# Patient Record
Sex: Male | Born: 1935
Health system: Southern US, Community
[De-identification: ages and names within clinical notes are randomized; demographics above are authoritative.]

---

## 2004-05-26 ENCOUNTER — Other Ambulatory Visit: Payer: Self-pay

## 2005-04-12 ENCOUNTER — Other Ambulatory Visit: Payer: Self-pay

## 2005-04-12 ENCOUNTER — Emergency Department: Payer: Self-pay | Admitting: Emergency Medicine

## 2005-04-13 ENCOUNTER — Other Ambulatory Visit: Payer: Self-pay

## 2005-04-13 ENCOUNTER — Observation Stay: Payer: Self-pay | Admitting: Infectious Diseases

## 2007-03-29 ENCOUNTER — Emergency Department: Payer: Self-pay | Admitting: Emergency Medicine

## 2011-10-10 ENCOUNTER — Observation Stay: Payer: Self-pay | Admitting: Internal Medicine

## 2011-10-10 DIAGNOSIS — I4891 Unspecified atrial fibrillation: Secondary | ICD-10-CM

## 2011-10-10 DIAGNOSIS — I379 Nonrheumatic pulmonary valve disorder, unspecified: Secondary | ICD-10-CM

## 2011-10-10 DIAGNOSIS — R748 Abnormal levels of other serum enzymes: Secondary | ICD-10-CM

## 2011-10-10 LAB — CK TOTAL AND CKMB (NOT AT ARMC)
CK, Total: 315 U/L — ABNORMAL HIGH (ref 35–232)
CK, Total: 233 U/L — ABNORMAL HIGH (ref 35–232)
CK-MB: 4.5 ng/mL — ABNORMAL HIGH (ref 0.5–3.6)
CK-MB: 3.5 ng/mL (ref 0.5–3.6)

## 2011-10-10 LAB — CBC
HGB: 15.6 g/dL (ref 13.0–18.0)
MCH: 31.6 pg (ref 26.0–34.0)
MCV: 95 fL (ref 80–100)
Platelet: 120 10*3/uL — ABNORMAL LOW (ref 150–440)
RBC: 4.95 10*6/uL (ref 4.40–5.90)
WBC: 7.2 10*3/uL (ref 3.8–10.6)

## 2011-10-10 LAB — URINALYSIS, COMPLETE
Glucose,UR: 50 mg/dL (ref 0–75)
Ketone: NEGATIVE
Nitrite: NEGATIVE
Protein: NEGATIVE
Specific Gravity: 1.001 (ref 1.003–1.030)
Squamous Epithelial: <1
WBC UR: 1 /HPF (ref 0–5)

## 2011-10-10 LAB — COMPREHENSIVE METABOLIC PANEL
Alkaline Phosphatase: 68 U/L (ref 50–136)
Calcium, Total: 9.4 mg/dL (ref 8.5–10.1)
Chloride: 104 mmol/L (ref 98–107)
Co2: 28 mmol/L (ref 21–32)
Creatinine: 0.99 mg/dL (ref 0.60–1.30)
EGFR (Non-African Amer.): >60
Glucose: 155 mg/dL — ABNORMAL HIGH (ref 65–99)
SGOT(AST): 30 U/L (ref 15–37)
SGPT (ALT): 18 U/L

## 2011-10-10 LAB — TROPONIN I
Troponin-I: 0.08 ng/mL — ABNORMAL HIGH
Troponin-I: 0.08 ng/mL — ABNORMAL HIGH

## 2011-10-10 LAB — APTT: Activated PTT: 33.7 secs (ref 23.6–35.9)

## 2011-10-13 ENCOUNTER — Emergency Department: Payer: Self-pay | Admitting: Emergency Medicine

## 2011-10-13 LAB — CBC WITH DIFFERENTIAL/PLATELET
Basophil #: 0 10*3/uL (ref 0.0–0.1)
Basophil %: 0.1 %
Eosinophil %: 0.1 %
HCT: 48.3 % (ref 40.0–52.0)
Lymphocyte #: 0.5 10*3/uL — ABNORMAL LOW (ref 1.0–3.6)
Lymphocyte %: 4.5 %
MCV: 95 fL (ref 80–100)
Monocyte %: 2.2 %
Neutrophil #: 10.6 10*3/uL — ABNORMAL HIGH (ref 1.4–6.5)
Platelet: 118 10*3/uL — ABNORMAL LOW (ref 150–440)
RBC: 5.11 10*6/uL (ref 4.40–5.90)
WBC: 11.4 10*3/uL — ABNORMAL HIGH (ref 3.8–10.6)

## 2011-10-13 LAB — PROTIME-INR
INR: 1
Prothrombin Time: 13.5 secs (ref 11.5–14.7)

## 2011-10-13 LAB — URINALYSIS, COMPLETE
Glucose,UR: >=500 mg/dL (ref 0–75)
Granular Cast: 1
Ketone: NEGATIVE
Leukocyte Esterase: NEGATIVE
Nitrite: NEGATIVE
Ph: 6 (ref 4.5–8.0)
Protein: 100
Specific Gravity: 1.006 (ref 1.003–1.030)
Squamous Epithelial: <1
WBC UR: <1 /HPF (ref 0–5)

## 2011-10-13 LAB — DRUG SCREEN, URINE
Amphetamines, Ur Screen: NEGATIVE (ref ?–1000)
Benzodiazepine, Ur Scrn: NEGATIVE (ref ?–200)
Cannabinoid 50 Ng, Ur ~~LOC~~: NEGATIVE (ref ?–50)
Cocaine Metabolite,Ur ~~LOC~~: NEGATIVE (ref ?–300)
MDMA (Ecstasy)Ur Screen: NEGATIVE (ref ?–500)

## 2011-10-13 LAB — COMPREHENSIVE METABOLIC PANEL
Albumin: 4.1 g/dL (ref 3.4–5.0)
Anion Gap: 12 (ref 7–16)
Calcium, Total: 9.3 mg/dL (ref 8.5–10.1)
Chloride: 96 mmol/L — ABNORMAL LOW (ref 98–107)
Co2: 31 mmol/L (ref 21–32)
EGFR (African American): >60
EGFR (Non-African Amer.): >60
Glucose: 207 mg/dL — ABNORMAL HIGH (ref 65–99)
Osmolality: 285 (ref 275–301)
Potassium: 3.8 mmol/L (ref 3.5–5.1)
SGOT(AST): 38 U/L — ABNORMAL HIGH (ref 15–37)
Sodium: 139 mmol/L (ref 136–145)

## 2011-10-13 LAB — CK TOTAL AND CKMB (NOT AT ARMC)
CK, Total: 517 U/L — ABNORMAL HIGH (ref 35–232)
CK-MB: 3.7 ng/mL — ABNORMAL HIGH (ref 0.5–3.6)

## 2011-10-13 LAB — ETHANOL: Ethanol: <3 mg/dL

## 2011-10-14 LAB — URINE CULTURE

## 2011-11-19 ENCOUNTER — Ambulatory Visit: Payer: Self-pay | Admitting: Internal Medicine

## 2011-11-21 ENCOUNTER — Inpatient Hospital Stay: Payer: Self-pay | Admitting: Internal Medicine

## 2011-11-21 LAB — CBC
HCT: 42.4 % (ref 40.0–52.0)
HGB: 13.6 g/dL (ref 13.0–18.0)
MCH: 31.2 pg (ref 26.0–34.0)
MCHC: 32 g/dL (ref 32.0–36.0)
RBC: 4.35 10*6/uL — ABNORMAL LOW (ref 4.40–5.90)
RDW: 16 % — ABNORMAL HIGH (ref 11.5–14.5)

## 2011-11-21 LAB — URINALYSIS, COMPLETE
Bacteria: NONE SEEN
Bilirubin,UR: NEGATIVE
Blood: NEGATIVE
Glucose,UR: >=500 mg/dL (ref 0–75)
Leukocyte Esterase: NEGATIVE
Nitrite: NEGATIVE
RBC,UR: 2 /HPF (ref 0–5)
Specific Gravity: 1.018 (ref 1.003–1.030)
Squamous Epithelial: <1
WBC UR: 2 /HPF (ref 0–5)

## 2011-11-21 LAB — COMPREHENSIVE METABOLIC PANEL
Albumin: 2.2 g/dL — ABNORMAL LOW (ref 3.4–5.0)
Alkaline Phosphatase: 129 U/L (ref 50–136)
Bilirubin,Total: 0.5 mg/dL (ref 0.2–1.0)
Calcium, Total: 9.5 mg/dL (ref 8.5–10.1)
Co2: 30 mmol/L (ref 21–32)
Creatinine: 1.66 mg/dL — ABNORMAL HIGH (ref 0.60–1.30)
EGFR (Non-African Amer.): 43 — ABNORMAL LOW
Glucose: 470 mg/dL — ABNORMAL HIGH (ref 65–99)
Osmolality: 356 (ref 275–301)
SGOT(AST): 108 U/L — ABNORMAL HIGH (ref 15–37)
Sodium: 155 mmol/L — ABNORMAL HIGH (ref 136–145)

## 2011-11-21 LAB — TROPONIN I: Troponin-I: 0.16 ng/mL — ABNORMAL HIGH

## 2011-11-21 LAB — PROTIME-INR
INR: 1.2
Prothrombin Time: 15.1 secs — ABNORMAL HIGH (ref 11.5–14.7)

## 2011-11-22 DIAGNOSIS — R748 Abnormal levels of other serum enzymes: Secondary | ICD-10-CM

## 2011-11-22 DIAGNOSIS — R000 Tachycardia, unspecified: Secondary | ICD-10-CM

## 2011-11-22 LAB — CBC WITH DIFFERENTIAL/PLATELET
Basophil #: 0 10*3/uL (ref 0.0–0.1)
HCT: 36.3 % — ABNORMAL LOW (ref 40.0–52.0)
Lymphocyte #: 0.9 10*3/uL — ABNORMAL LOW (ref 1.0–3.6)
MCH: 31.1 pg (ref 26.0–34.0)
MCHC: 32.3 g/dL (ref 32.0–36.0)
MCV: 96 fL (ref 80–100)
Monocyte #: 0.6 10*3/uL (ref 0.0–0.7)
Monocyte %: 2.3 %
Neutrophil #: 23.5 10*3/uL — ABNORMAL HIGH (ref 1.4–6.5)
Neutrophil %: 94.1 %
Platelet: 170 10*3/uL (ref 150–440)
RBC: 3.77 10*6/uL — ABNORMAL LOW (ref 4.40–5.90)
RDW: 15.9 % — ABNORMAL HIGH (ref 11.5–14.5)
WBC: 25 10*3/uL — ABNORMAL HIGH (ref 3.8–10.6)

## 2011-11-22 LAB — BASIC METABOLIC PANEL
Anion Gap: 11 (ref 7–16)
BUN: 85 mg/dL — ABNORMAL HIGH (ref 7–18)
Calcium, Total: 9 mg/dL (ref 8.5–10.1)
Calcium, Total: 9.2 mg/dL (ref 8.5–10.1)
Co2: 29 mmol/L (ref 21–32)
Creatinine: 1.51 mg/dL — ABNORMAL HIGH (ref 0.60–1.30)
EGFR (African American): 58 — ABNORMAL LOW
EGFR (African American): 53 — ABNORMAL LOW
EGFR (Non-African Amer.): 48 — ABNORMAL LOW
EGFR (Non-African Amer.): 44 — ABNORMAL LOW
Glucose: 317 mg/dL — ABNORMAL HIGH (ref 65–99)
Glucose: 260 mg/dL — ABNORMAL HIGH (ref 65–99)
Osmolality: 353 (ref 275–301)
Osmolality: 340 (ref 275–301)
Sodium: 159 mmol/L — ABNORMAL HIGH (ref 136–145)
Sodium: 154 mmol/L — ABNORMAL HIGH (ref 136–145)

## 2011-11-22 LAB — TROPONIN I: Troponin-I: 0.2 ng/mL — ABNORMAL HIGH

## 2011-11-23 LAB — CBC WITH DIFFERENTIAL/PLATELET
Basophil #: 0 10*3/uL (ref 0.0–0.1)
HCT: 35.5 % — ABNORMAL LOW (ref 40.0–52.0)
Lymphocyte %: 3.9 %
MCHC: 31.9 g/dL — ABNORMAL LOW (ref 32.0–36.0)
Monocyte %: 2.8 %
Neutrophil %: 92.6 %
Platelet: 123 10*3/uL — ABNORMAL LOW (ref 150–440)
RDW: 15.9 % — ABNORMAL HIGH (ref 11.5–14.5)
WBC: 14.9 10*3/uL — ABNORMAL HIGH (ref 3.8–10.6)

## 2011-11-23 LAB — COMPREHENSIVE METABOLIC PANEL
Albumin: 1.7 g/dL — ABNORMAL LOW (ref 3.4–5.0)
Anion Gap: 15 (ref 7–16)
BUN: 75 mg/dL — ABNORMAL HIGH (ref 7–18)
Calcium, Total: 8.5 mg/dL (ref 8.5–10.1)
Chloride: 112 mmol/L — ABNORMAL HIGH (ref 98–107)
Co2: 27 mmol/L (ref 21–32)
Creatinine: 1.62 mg/dL — ABNORMAL HIGH (ref 0.60–1.30)
EGFR (African American): 54 — ABNORMAL LOW
EGFR (Non-African Amer.): 44 — ABNORMAL LOW
Glucose: 347 mg/dL — ABNORMAL HIGH (ref 65–99)
Osmolality: 342 (ref 275–301)
Potassium: 3.2 mmol/L — ABNORMAL LOW (ref 3.5–5.1)
SGOT(AST): 37 U/L (ref 15–37)
Total Protein: 6.8 g/dL (ref 6.4–8.2)

## 2011-11-23 LAB — MAGNESIUM: Magnesium: 2.1 mg/dL

## 2011-11-23 LAB — UR PROT ELECTROPHORESIS, URINE RANDOM

## 2011-11-24 LAB — POTASSIUM: Potassium: 3.7 mmol/L (ref 3.5–5.1)

## 2011-11-24 LAB — CBC WITH DIFFERENTIAL/PLATELET
Basophil %: 0 %
Eosinophil #: 0.4 10*3/uL (ref 0.0–0.7)
Eosinophil %: 2.6 %
HGB: 9.9 g/dL — ABNORMAL LOW (ref 13.0–18.0)
Lymphocyte #: 1 10*3/uL (ref 1.0–3.6)
MCHC: 32.8 g/dL (ref 32.0–36.0)
MCV: 95 fL (ref 80–100)
Monocyte #: 0.4 10*3/uL (ref 0.0–0.7)
RDW: 15.3 % — ABNORMAL HIGH (ref 11.5–14.5)
WBC: 14.4 10*3/uL — ABNORMAL HIGH (ref 3.8–10.6)

## 2011-11-24 LAB — BASIC METABOLIC PANEL
Anion Gap: 12 (ref 7–16)
Calcium, Total: 8 mg/dL — ABNORMAL LOW (ref 8.5–10.1)
Chloride: 108 mmol/L — ABNORMAL HIGH (ref 98–107)
Co2: 28 mmol/L (ref 21–32)
Creatinine: 1.36 mg/dL — ABNORMAL HIGH (ref 0.60–1.30)
EGFR (African American): >60
Glucose: 130 mg/dL — ABNORMAL HIGH (ref 65–99)
Potassium: 2.9 mmol/L — ABNORMAL LOW (ref 3.5–5.1)
Sodium: 148 mmol/L — ABNORMAL HIGH (ref 136–145)

## 2011-11-25 LAB — BASIC METABOLIC PANEL
Anion Gap: 11 (ref 7–16)
BUN: 30 mg/dL — ABNORMAL HIGH (ref 7–18)
Calcium, Total: 8.3 mg/dL — ABNORMAL LOW (ref 8.5–10.1)
Co2: 28 mmol/L (ref 21–32)
EGFR (African American): >60
EGFR (Non-African Amer.): >60
Glucose: 194 mg/dL — ABNORMAL HIGH (ref 65–99)
Osmolality: 308 (ref 275–301)
Potassium: 3.6 mmol/L (ref 3.5–5.1)

## 2011-11-26 LAB — BASIC METABOLIC PANEL
BUN: 18 mg/dL (ref 7–18)
Calcium, Total: 8.2 mg/dL — ABNORMAL LOW (ref 8.5–10.1)
Chloride: 111 mmol/L — ABNORMAL HIGH (ref 98–107)
Creatinine: 1.02 mg/dL (ref 0.60–1.30)
EGFR (African American): >60
EGFR (Non-African Amer.): >60
Glucose: 163 mg/dL — ABNORMAL HIGH (ref 65–99)
Sodium: 149 mmol/L — ABNORMAL HIGH (ref 136–145)

## 2011-11-26 LAB — CBC WITH DIFFERENTIAL/PLATELET
Eosinophil %: 3.9 %
HCT: 30.6 % — ABNORMAL LOW (ref 40.0–52.0)
Lymphocyte #: 0.9 10*3/uL — ABNORMAL LOW (ref 1.0–3.6)
Lymphocyte %: 7 %
MCHC: 32.1 g/dL (ref 32.0–36.0)
MCV: 96 fL (ref 80–100)
Monocyte %: 3.7 %
Neutrophil %: 85.4 %
Platelet: 128 10*3/uL — ABNORMAL LOW (ref 150–440)
RBC: 3.2 10*6/uL — ABNORMAL LOW (ref 4.40–5.90)
WBC: 13.6 10*3/uL — ABNORMAL HIGH (ref 3.8–10.6)

## 2011-11-26 LAB — CLOSTRIDIUM DIFFICILE BY PCR

## 2011-11-26 LAB — EXPECTORATED SPUTUM ASSESSMENT W GRAM STAIN, RFLX TO RESP C

## 2011-11-27 LAB — BASIC METABOLIC PANEL
Anion Gap: 10 (ref 7–16)
Calcium, Total: 8.1 mg/dL — ABNORMAL LOW (ref 8.5–10.1)
Co2: 28 mmol/L (ref 21–32)
EGFR (African American): >60
Osmolality: 301 (ref 275–301)

## 2011-11-27 LAB — CULTURE, BLOOD (SINGLE)

## 2011-11-28 LAB — CBC WITH DIFFERENTIAL/PLATELET
Basophil #: 0 10*3/uL (ref 0.0–0.1)
Eosinophil %: 2.1 %
HCT: 32.8 % — ABNORMAL LOW (ref 40.0–52.0)
Lymphocyte #: 0.9 10*3/uL — ABNORMAL LOW (ref 1.0–3.6)
MCV: 96 fL (ref 80–100)
Monocyte %: 5.1 %
Neutrophil #: 9.7 10*3/uL — ABNORMAL HIGH (ref 1.4–6.5)
RBC: 3.41 10*6/uL — ABNORMAL LOW (ref 4.40–5.90)
WBC: 11.4 10*3/uL — ABNORMAL HIGH (ref 3.8–10.6)

## 2011-12-06 ENCOUNTER — Inpatient Hospital Stay: Payer: Self-pay | Admitting: Internal Medicine

## 2011-12-06 LAB — COMPREHENSIVE METABOLIC PANEL
Albumin: 1.8 g/dL — ABNORMAL LOW (ref 3.4–5.0)
Anion Gap: 12 (ref 7–16)
BUN: 34 mg/dL — ABNORMAL HIGH (ref 7–18)
Bilirubin,Total: 0.8 mg/dL (ref 0.2–1.0)
Calcium, Total: 8.2 mg/dL — ABNORMAL LOW (ref 8.5–10.1)
Chloride: 101 mmol/L (ref 98–107)
Co2: 29 mmol/L (ref 21–32)
EGFR (African American): >60
EGFR (Non-African Amer.): >60
Glucose: 255 mg/dL — ABNORMAL HIGH (ref 65–99)
Osmolality: 299 (ref 275–301)
Potassium: 5 mmol/L (ref 3.5–5.1)
SGPT (ALT): 40 U/L
Sodium: 142 mmol/L (ref 136–145)
Total Protein: 6.3 g/dL — ABNORMAL LOW (ref 6.4–8.2)

## 2011-12-06 LAB — CBC
HCT: 38.4 % — ABNORMAL LOW (ref 40.0–52.0)
HGB: 12.7 g/dL — ABNORMAL LOW (ref 13.0–18.0)
MCH: 31.9 pg (ref 26.0–34.0)
MCHC: 33 g/dL (ref 32.0–36.0)
MCV: 97 fL (ref 80–100)

## 2011-12-07 LAB — BASIC METABOLIC PANEL
Anion Gap: 11 (ref 7–16)
BUN: 25 mg/dL — ABNORMAL HIGH (ref 7–18)
Chloride: 111 mmol/L — ABNORMAL HIGH (ref 98–107)
Co2: 24 mmol/L (ref 21–32)
EGFR (African American): >60
Osmolality: 296 (ref 275–301)
Potassium: 3.8 mmol/L (ref 3.5–5.1)

## 2011-12-07 LAB — CBC WITH DIFFERENTIAL/PLATELET
Basophil #: 0 10*3/uL (ref 0.0–0.1)
Eosinophil #: 0.1 10*3/uL (ref 0.0–0.7)
Lymphocyte #: 0.6 10*3/uL — ABNORMAL LOW (ref 1.0–3.6)
Lymphocyte %: 3.6 %
MCH: 32.1 pg (ref 26.0–34.0)
MCV: 98 fL (ref 80–100)
Monocyte #: 0.6 10*3/uL (ref 0.0–0.7)
Monocyte %: 3.5 %
Platelet: 245 10*3/uL (ref 150–440)
RDW: 19 % — ABNORMAL HIGH (ref 11.5–14.5)
WBC: 16 10*3/uL — ABNORMAL HIGH (ref 3.8–10.6)

## 2011-12-08 LAB — CBC WITH DIFFERENTIAL/PLATELET
Basophil #: 0 10*3/uL (ref 0.0–0.1)
Basophil %: 0 %
Eosinophil #: 0.2 10*3/uL (ref 0.0–0.7)
HCT: 36.1 % — ABNORMAL LOW (ref 40.0–52.0)
HGB: 11.7 g/dL — ABNORMAL LOW (ref 13.0–18.0)
Lymphocyte #: 1.1 10*3/uL (ref 1.0–3.6)
Lymphocyte %: 6.5 %
MCHC: 32.4 g/dL (ref 32.0–36.0)
MCV: 99 fL (ref 80–100)
Monocyte %: 5.2 %
Neutrophil #: 14.2 10*3/uL — ABNORMAL HIGH (ref 1.4–6.5)
RDW: 19.2 % — ABNORMAL HIGH (ref 11.5–14.5)
WBC: 16.2 10*3/uL — ABNORMAL HIGH (ref 3.8–10.6)

## 2011-12-08 LAB — BASIC METABOLIC PANEL
Anion Gap: 7 (ref 7–16)
BUN: 21 mg/dL — ABNORMAL HIGH (ref 7–18)
Calcium, Total: 7.7 mg/dL — ABNORMAL LOW (ref 8.5–10.1)
EGFR (African American): >60
EGFR (Non-African Amer.): >60
Glucose: 204 mg/dL — ABNORMAL HIGH (ref 65–99)
Osmolality: 285 (ref 275–301)
Potassium: 3.6 mmol/L (ref 3.5–5.1)

## 2011-12-10 LAB — CBC WITH DIFFERENTIAL/PLATELET
Basophil #: 0 10*3/uL (ref 0.0–0.1)
Basophil %: 0 %
Eosinophil #: 0.4 10*3/uL (ref 0.0–0.7)
Eosinophil %: 3.5 %
HCT: 32 % — ABNORMAL LOW (ref 40.0–52.0)
Lymphocyte %: 8.3 %
MCH: 32.2 pg (ref 26.0–34.0)
Monocyte #: 0.7 10*3/uL (ref 0.0–0.7)
Monocyte %: 5.9 %
Neutrophil #: 9.6 10*3/uL — ABNORMAL HIGH (ref 1.4–6.5)
Neutrophil %: 82.3 %
Platelet: 190 10*3/uL (ref 150–440)
RBC: 3.22 10*6/uL — ABNORMAL LOW (ref 4.40–5.90)
WBC: 11.6 10*3/uL — ABNORMAL HIGH (ref 3.8–10.6)

## 2011-12-10 LAB — BASIC METABOLIC PANEL
Anion Gap: 6 — ABNORMAL LOW (ref 7–16)
BUN: 13 mg/dL (ref 7–18)
Calcium, Total: 7.7 mg/dL — ABNORMAL LOW (ref 8.5–10.1)
Chloride: 104 mmol/L (ref 98–107)
Co2: 31 mmol/L (ref 21–32)
Osmolality: 288 (ref 275–301)
Potassium: 3.8 mmol/L (ref 3.5–5.1)

## 2011-12-11 LAB — CBC WITH DIFFERENTIAL/PLATELET
Eosinophil #: 0.3 10*3/uL (ref 0.0–0.7)
Eosinophil %: 3.4 %
MCH: 31.7 pg (ref 26.0–34.0)
MCHC: 32.3 g/dL (ref 32.0–36.0)
Monocyte #: 0.7 10*3/uL (ref 0.0–0.7)
Monocyte %: 7.1 %
Neutrophil %: 78.5 %
Platelet: 194 10*3/uL (ref 150–440)
RBC: 3.28 10*6/uL — ABNORMAL LOW (ref 4.40–5.90)

## 2011-12-11 LAB — CULTURE, BLOOD (SINGLE)

## 2011-12-12 LAB — CLOSTRIDIUM DIFFICILE BY PCR

## 2011-12-20 ENCOUNTER — Ambulatory Visit: Payer: Self-pay | Admitting: Internal Medicine

## 2011-12-28 ENCOUNTER — Emergency Department: Payer: Self-pay | Admitting: Emergency Medicine

## 2012-01-07 ENCOUNTER — Emergency Department: Payer: Self-pay | Admitting: Emergency Medicine

## 2012-01-09 ENCOUNTER — Emergency Department: Payer: Self-pay | Admitting: Emergency Medicine

## 2012-02-19 DEATH — deceased

## 2013-11-05 IMAGING — CT CT MAXILLOFACIAL WITHOUT CONTRAST
1 series · 16 of 30 positions shown, 20 images · non-contrast
Comparison: none

REASON FOR EXAM: fall w/ blood on face, ams
COMMENTS:

PROCEDURE:     CT  - CT MAXILLOFACIAL AREA WO  - October 13, 2011  [DATE]
RESULT:     History: Fall.

[Series 2: facial 3.0 h60f · axial · 0.34mm/px · z∈[-160,+16]mm · 16 of 65 slices shown, 20 images]
[im 3/65  brain]
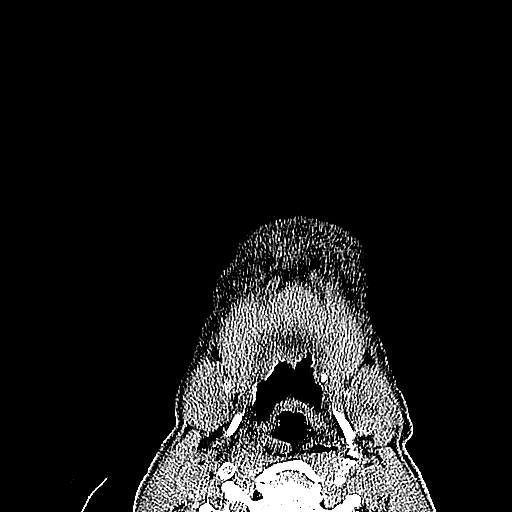
[im 3/65  bone]
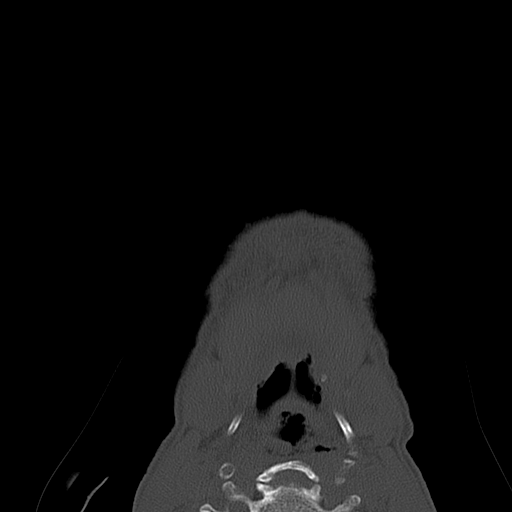
[im 7/65  bone]
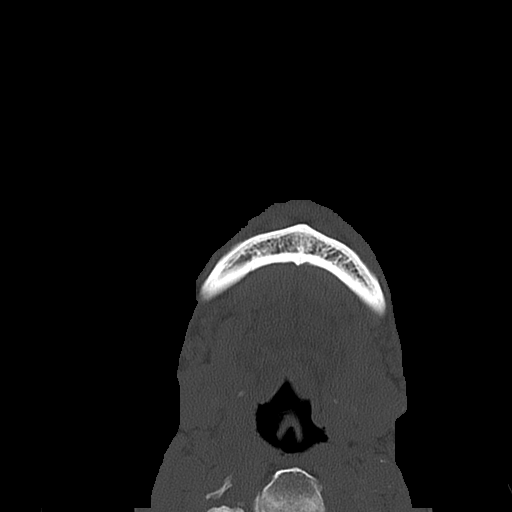
[im 12/65  bone]
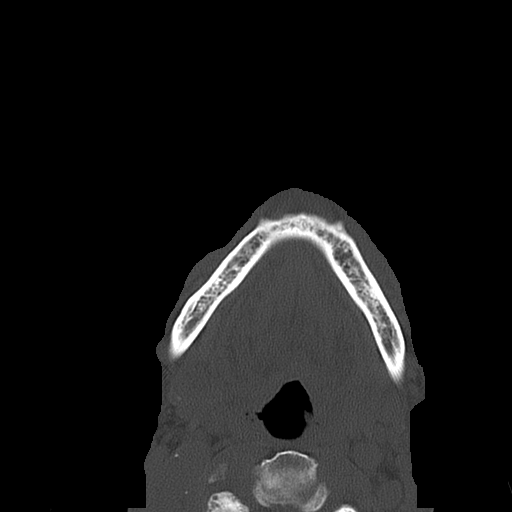
[im 16/65  bone]
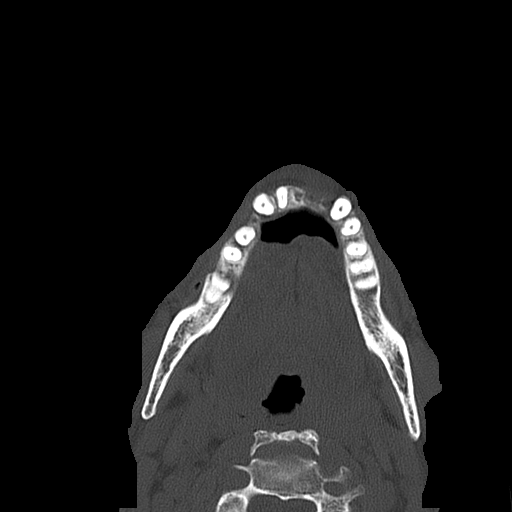
[im 18/65  brain]
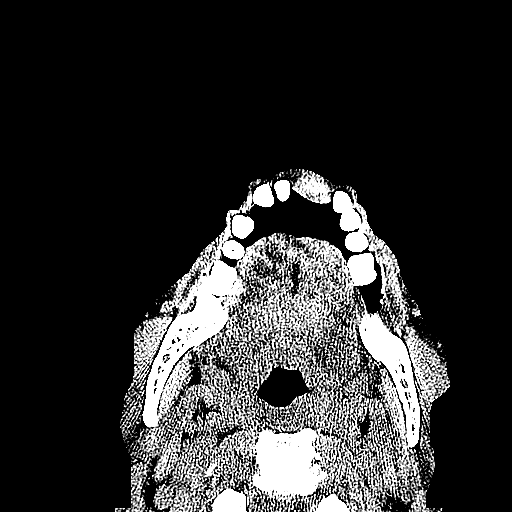
[im 18/65  bone]
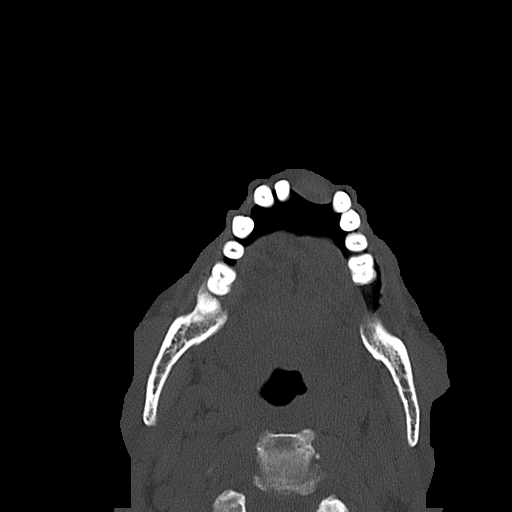
[im 23/65  bone]
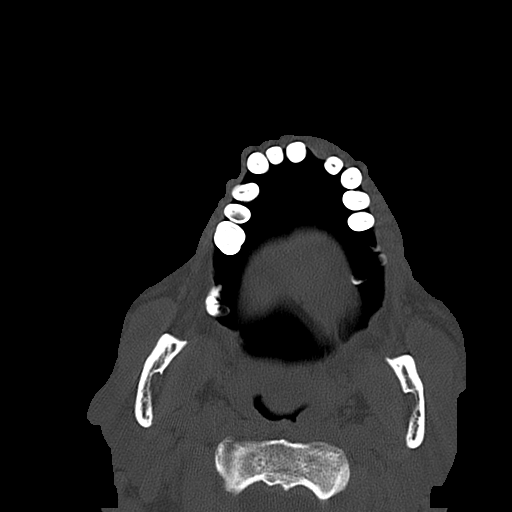
[im 27/65  bone]
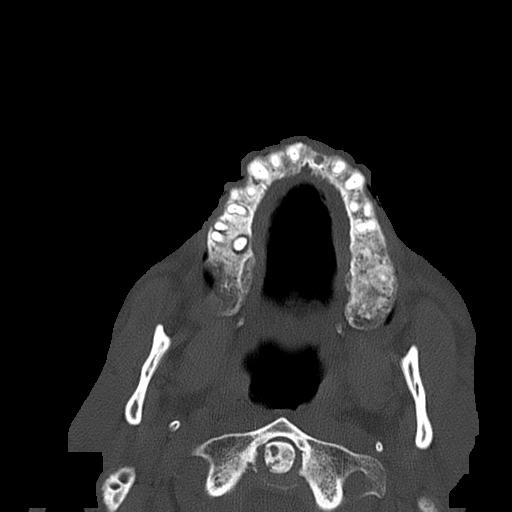
[im 31/65  bone]
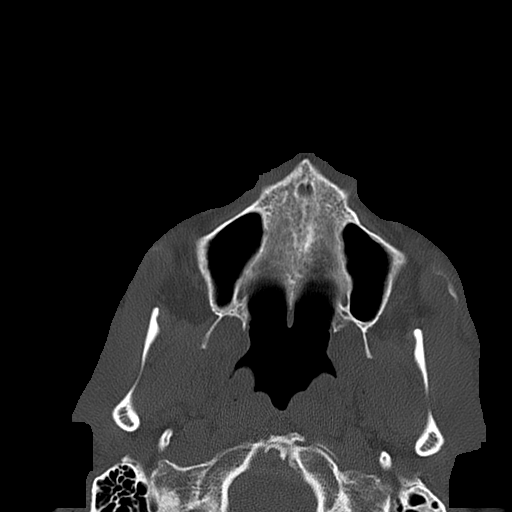
[im 34/65  brain]
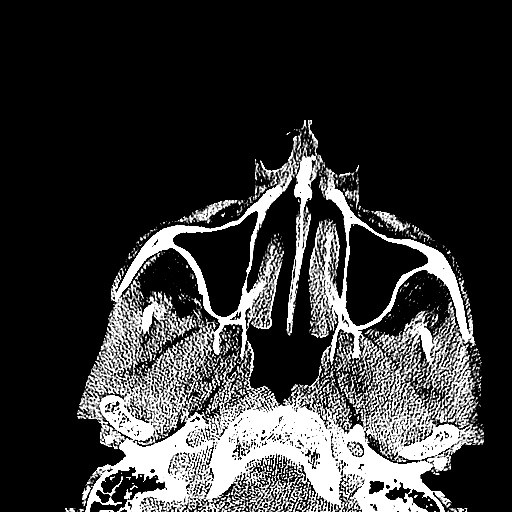
[im 34/65  bone]
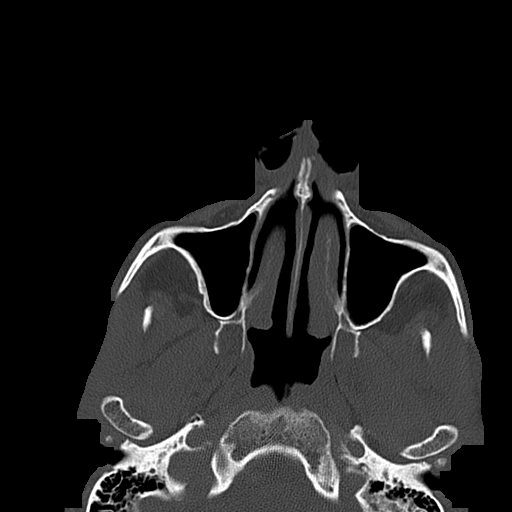
[im 38/65  bone]
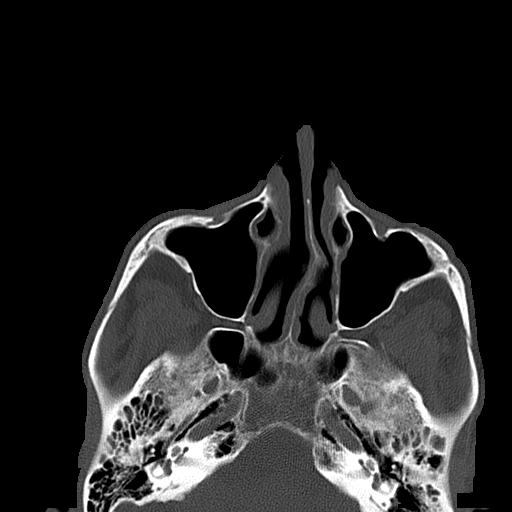
[im 42/65  bone]
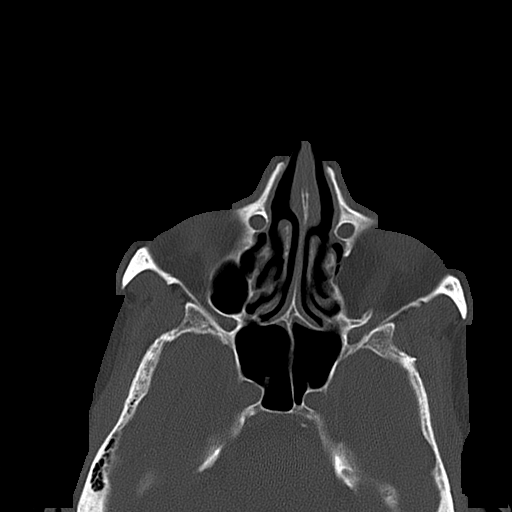
[im 47/65  bone]
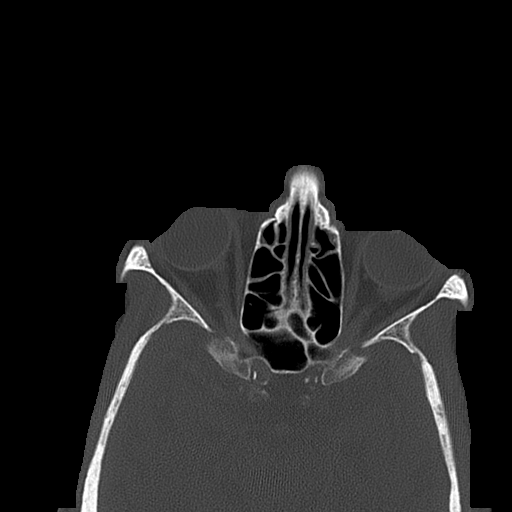
[im 49/65  brain]
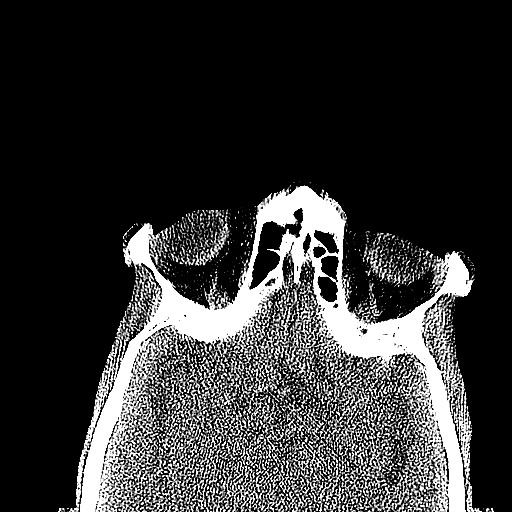
[im 49/65  bone]
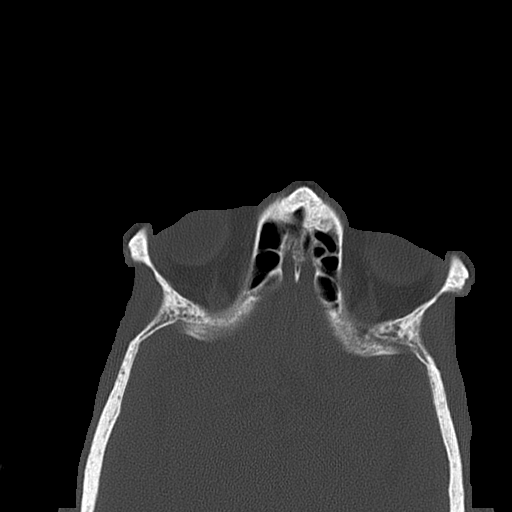
[im 53/65  bone]
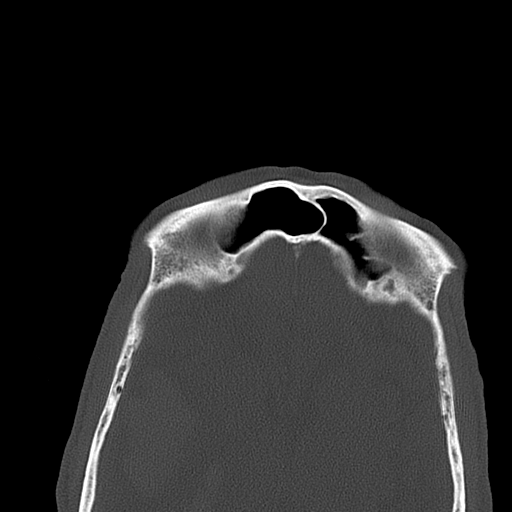
[im 58/65  bone]
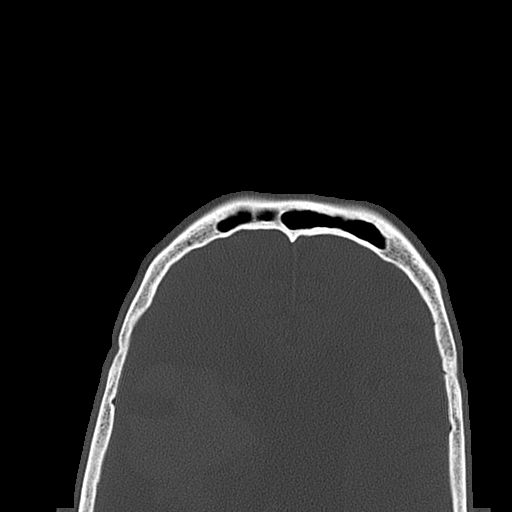
[im 62/65  bone]
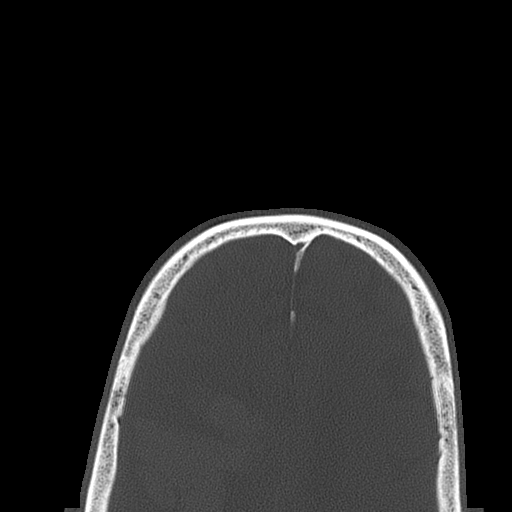

[16 of 30 positions shown; findings below may reference images not displayed]

FINDINGS: Standard CT obtained. No evidence of fracture clear paranasal
sinuses are clear. Mandibles intact. Orbits are intact.
IMPRESSION: No acute abnormality.

## 2013-11-05 IMAGING — CT CT CERVICAL SPINE WITHOUT CONTRAST
1 series · 12 of 14 positions shown, 15 images · non-contrast
Comparison: none

REASON FOR EXAM: ams with poss fall
COMMENTS:

PROCEDURE:     CT  - CT CERVICAL SPINE WO  - October 13, 2011  [DATE]
RESULT:     History: Fall.
Comparison Study: No prior.

[Series 5: axial · axial · 0.24mm/px · z∈[-242,-74]mm · 12 of 101 slices shown, 15 images]
[im 8/101  soft-tissue]
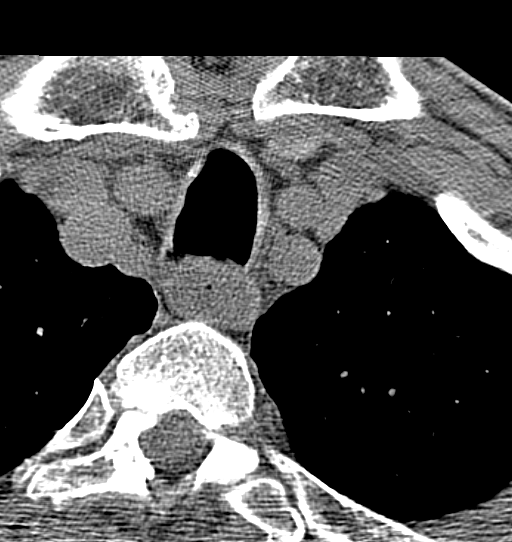
[im 8/101  bone]
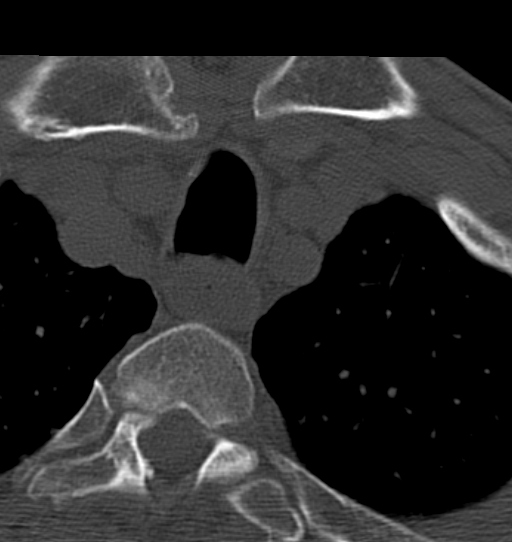
[im 16/101  bone]
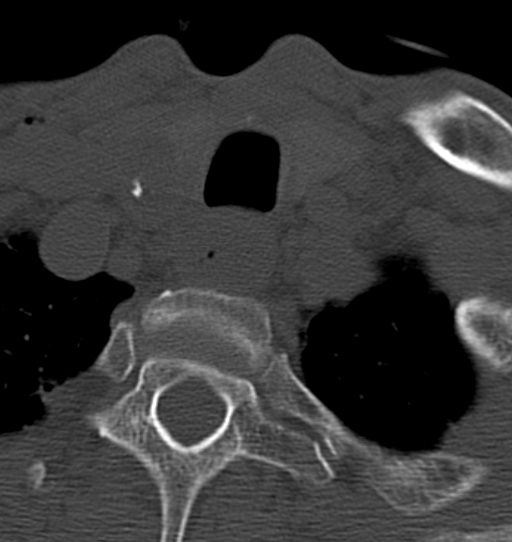
[im 24/101  bone]
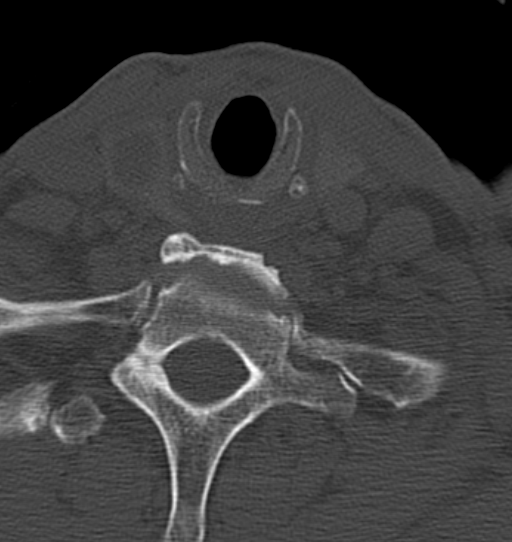
[im 31/101  bone]
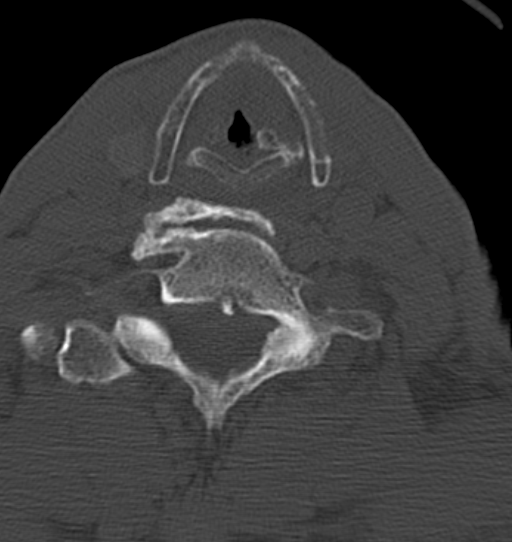
[im 39/101  soft-tissue]
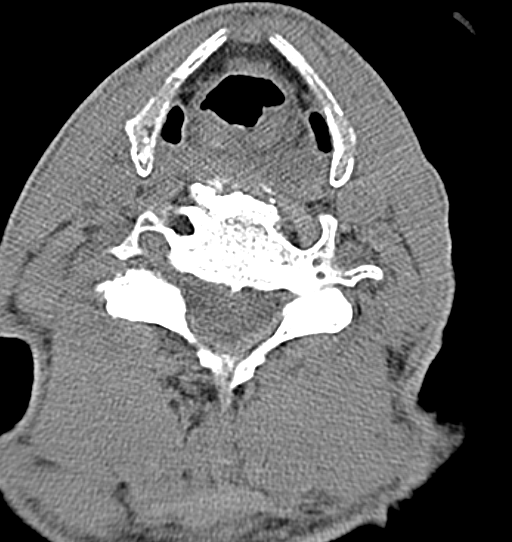
[im 39/101  bone]
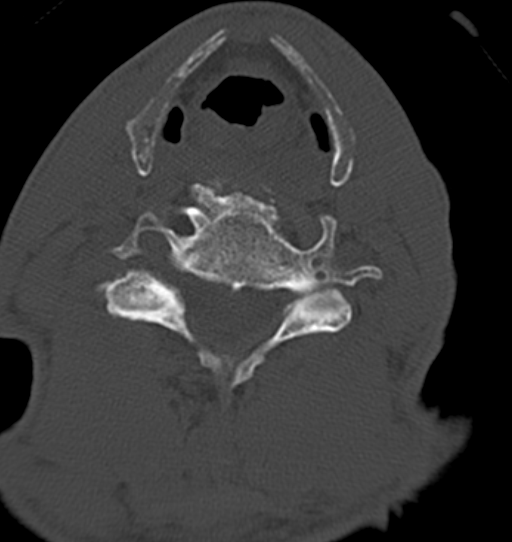
[im 47/101  bone]
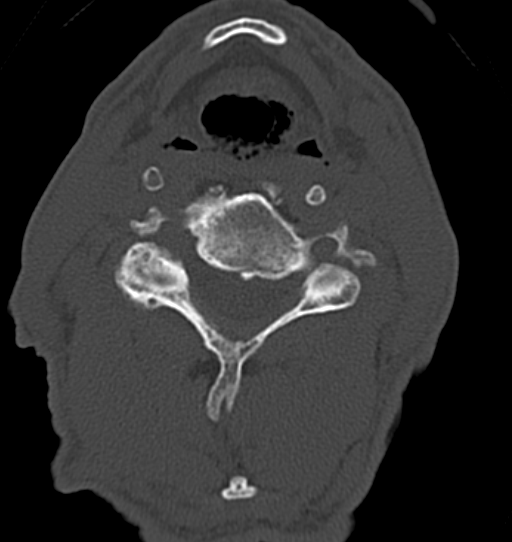
[im 54/101  bone]
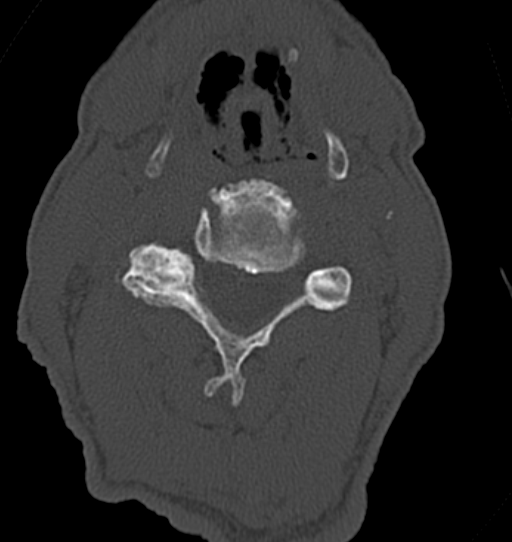
[im 62/101  bone]
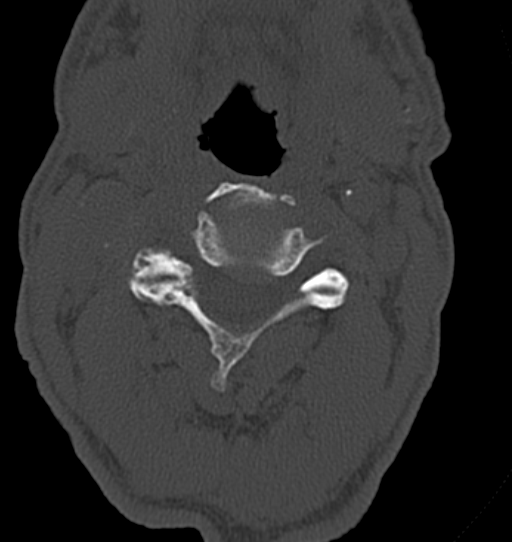
[im 70/101  soft-tissue]
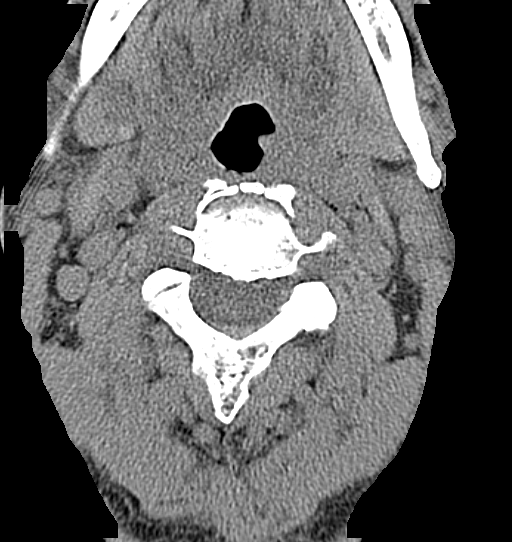
[im 70/101  bone]
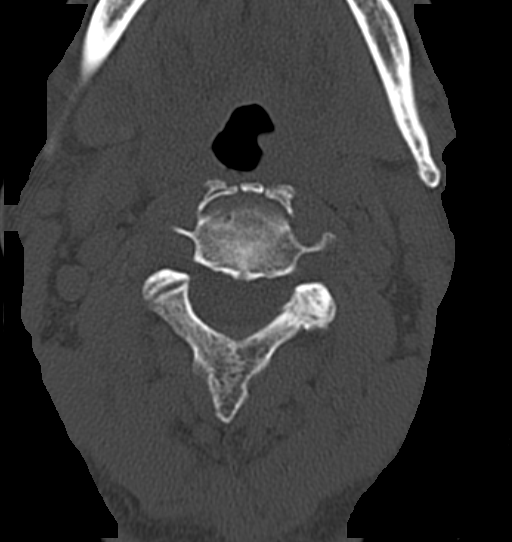
[im 77/101  bone]
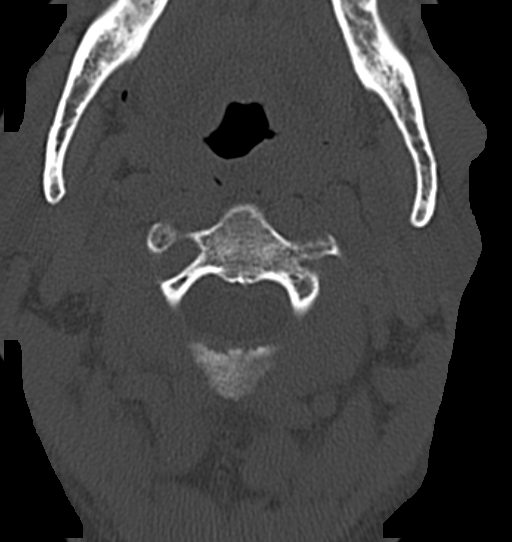
[im 85/101  bone]
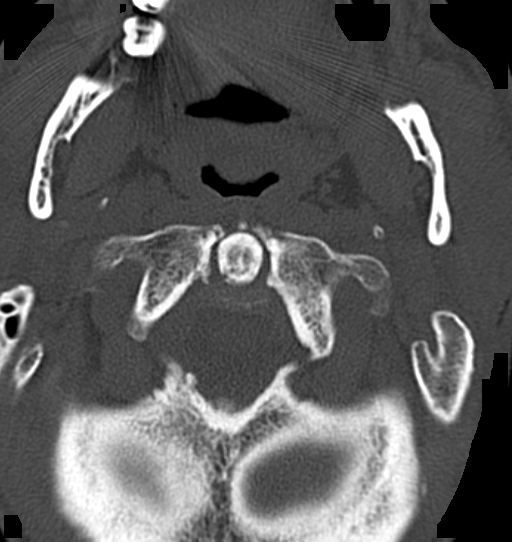
[im 93/101  bone]
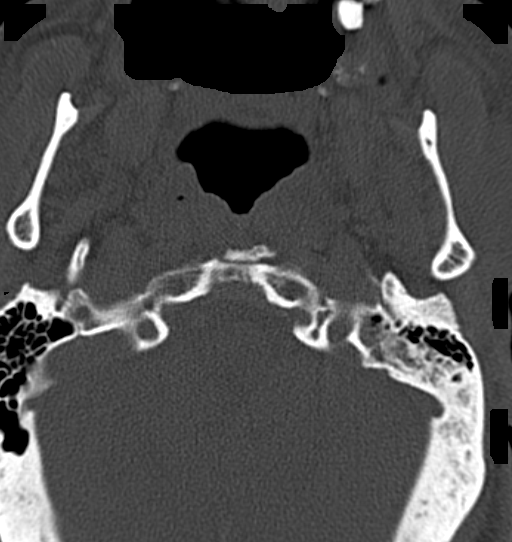

[12 of 14 positions shown; findings below may reference images not displayed]

FINDINGS: Standard CT obtained. Diffuse degenerative change noted. Is no
evidence of fracture or dislocation. Interstitial thickening is noted.
Subchondral cyst are noted in the odontoid process. No erosions are noted.
No soft tissue swelling noted.
IMPRESSION: Changes consistent severe degenerative disease. No acute
abnormality.

## 2013-11-05 IMAGING — CR DG CHEST 1V PORT
1 series · 1 of 1 positions shown · non-contrast
Comparison: none

REASON FOR EXAM: post intubation
COMMENTS:

[portable]
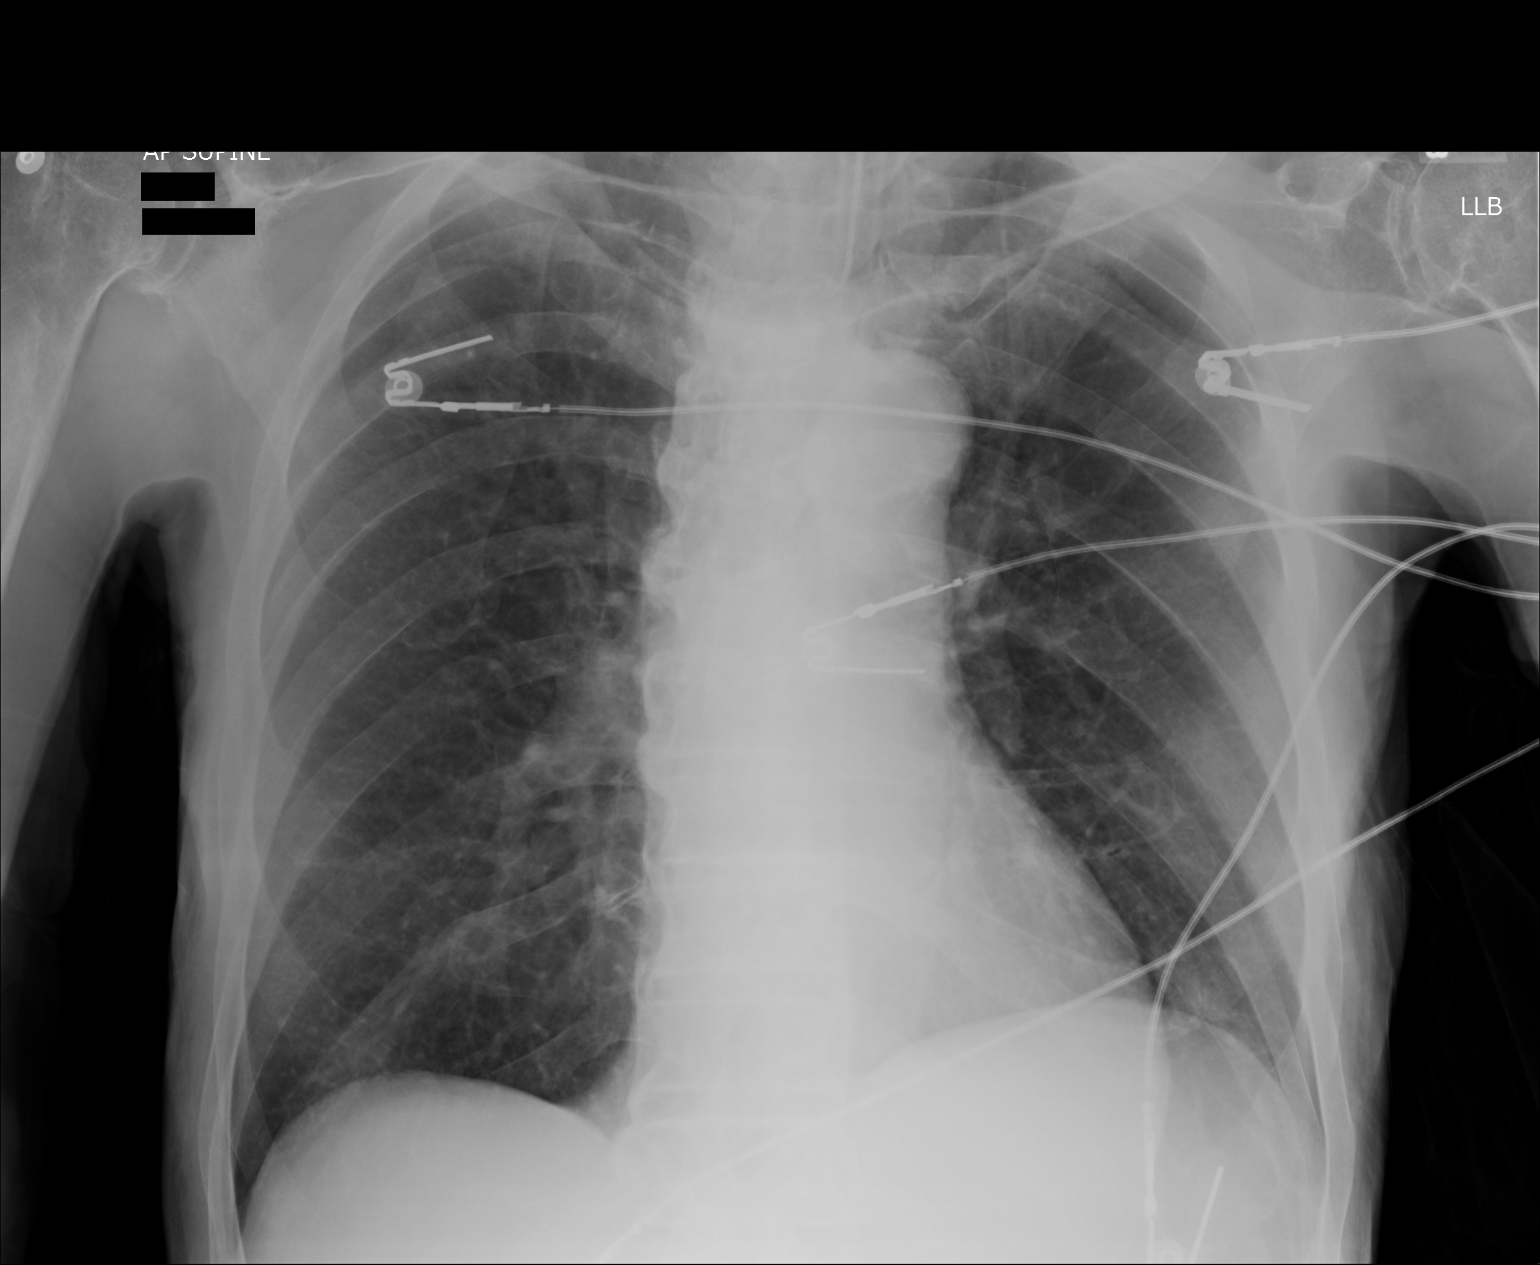

[1 of 1 positions shown; findings below may reference images not displayed]

PROCEDURE:     DXR - DXR PORTABLE CHEST SINGLE VIEW  - October 13, 2011 [DATE]

RESULT:     Comparison is made to a study 13 October, 2011.

The lungs are adequately inflated. There is an endotracheal tube in place
whose tip lies at the level of the inferior margin of the clavicular heads.
The cardiac silhouette is normal in size. The pulmonary vascularity is not
engorged.
IMPRESSION: The patient is undergone interval intubation of the trachea
with the tip of the tube at the level of the inferior margin of the
clavicular heads.

## 2015-01-12 NOTE — Consult Note (Signed)
PATIENT NAME:  Dylan Cohen, Dylan Cohen MR#:  [id number] DATE OF BIRTH:  10/28/1932  DATE OF CONSULTATION:  11/23/2011  REFERRING PHYSICIAN:  Dr. Cherylann RatelLateef  CONSULTING PHYSICIAN:  Scott C. Stoioff, MD  REASON FOR CONSULTATION: Left hydronephrosis.   HISTORY OF PRESENT ILLNESS: The patient is a 81 year old male admitted 11/21/2011 after pulling out his trach at his skilled nursing facility. On 10/13/2011 he was evaluated in the Emergency Department and found to have a large temporoparietal hemorrhage and bilateral ventricular hemorrhage. [facility] and was hospitalized for approximately three weeks. A tracheostomy and PEG were placed at that hospitalization. He was discharged [facility] approximately a week and a half ago. Creatinine on admission was 1.66. Prior creatinine in January was less than 1. I was unable to locate his creatinine at [facility]. Renal ultrasound was performed which was felt to show moderate left hydronephrosis. A noncontrast CT scan was performed today. The patient is nonresponsive and unable to provide a history or review of systems. There were no family members present.   PAST MEDICAL HISTORY:  1. Coronary artery disease.  2. Diabetes.  3. Hypertension. 4. History of benign prostatic hypertrophy.  5. Atrial fibrillation. 6. Dementia.   PAST SURGICAL HISTORY:  1. Coronary artery bypass graft. 2. Abdominal aortic aneurysm repair in 2005.  3. Tracheostomy. 4. PEG tube.   PHYSICAL EXAMINATION:   VITAL SIGNS: Temperature 99, blood pressure 115/77, pulse 78. The patient is on vasopressors.   GENERAL: The patient is sleeping.  RADIOLOGY: CT abdomen and pelvis without contrast was reviewed with Radiology. No renal or ureteral calculi are seen. There is a left extrarenal pelvis with mild dilation. There is no significant dilation of the renal calyces. The proximal ureter migrates anteriorly in the region of his abdominal aortic aneurysm repair.    LABORATORY DATA THIS MORNING: Creatinine 1.62, BUN 75.   IMPRESSION: Left extrarenal pelvis with mild dilation. This most likely is a chronic finding.   RECOMMENDATION: I would not recommend any intervention at this time. When clinical status improves, could be considered for a Lasix diuretic renogram.   ____________________________ Verna CzechScott C. Lonna CobbStoioff, MD scs:drc D: 11/23/2011 18:18:44 ET T: 11/24/2011 08:17:20 ET JOB#: [id number]  cc: Lorin PicketScott C. Lonna CobbStoioff, MD, <Dictator> Riki AltesSCOTT C STOIOFF MD ELECTRONICALLY SIGNED 11/29/2011 18:17

## 2015-01-12 NOTE — Consult Note (Signed)
PATIENT NAME:  Dylan GroutBOLDEN, Guiseppe E MR#:  [id number] DATE OF BIRTH:  [birth year]  DATE OF CONSULTATION:  11/22/2011  REFERRING PHYSICIAN:  Fredia SorrowAbhinav Gupta, MD CONSULTING PHYSICIAN:  Karsen Nakanishi Lizabeth LeydenN. Jayleena Stille, MD  REASON FOR CONSULTATION: Acute renal failure.   HISTORY OF PRESENT ILLNESS: The patient is a 81 year old African American male with past medical history of coronary artery disease status post coronary artery bypass graft, diabetes mellitus, hypertension, benign prostatic hypertrophy, history of abdominal aortic aneurysm status post repair, paroxysmal atrial fibrillation, vascular dementia, tobacco abuse, recent right temporal parietal hemorrhagic cerebrovascular accident on 10/13/2011, and status post tracheostomy and PEG tube placement who was admitted to [facility] for decreased level of consciousness. As above, the patient had recent right temporoparietal hemorrhagic cerebrovascular accident on 10/13/2011. He [facility] an [facility]. He eventually received tracheostomy and PEG tube placement. He never fully reached his prior baseline. The patient has been nonverbal with family. He was transitioned [facility]. There he was receiving physical therapy as well as ongoing nutritional support. The patient's family states that he was receiving tube feeds; however, it is unclear as to whether he was receiving free water in addition. The patient's serum sodium was 155 upon admission. BUN was also elevated at 90 with a creatinine of 1.65. BUN is now down to 85 with a creatinine of 1.51. The patient is also hyperglycemic with serum glucose of 317. He also appears to have significant leukocytosis with WBC count of 25,000. He had a left lower lobe infiltrate consistent with pneumonia. Urinalysis was unremarkable with the exception of glucosuria. Urine culture is thus far negative. He was subsequently transferred to the  Critical Care Unit this a.m. The patient is unable to provide any history at this point in time.   PAST MEDICAL HISTORY:  1. Diabetes mellitus.  2. Hypertension.  3. Benign prostatic hypertrophy.  4. Coronary artery disease status post coronary artery bypass graft.  5. Abdominal aortic aneurysm status post repair at [facility] in 2005.  6. Paroxysmal atrial fibrillation.  7. Vascular dementia.  8. History of tobacco abuse.  9. Right temporal parietal hemorrhagic cerebrovascular accident status post PEG tube placement and tracheostomy, performed [facility].   PAST SURGICAL HISTORY:  1. Coronary artery bypass graft.  2. Abdominal aortic aneurysm repair.  3. Tracheostomy.  4. PEG tube placement.   ALLERGIES: No known drug allergies.   CURRENT INPATIENT MEDICATIONS:  1. Half normal saline at 125 mL/hour. 2. Amiodarone drip.  3. Tylenol 650 mg every four hours p.r.n.  4. Aspirin 81 mg daily.  5. Sliding scale insulin.  6. Keppra 500 mg every 12 hours.  7. Zofran 4 mg IV every four hours. 8. Zosyn 3.375 grams IV every eight hours. 9. Zyvox 600 mg IV every 12 hours.  10. Metoprolol 5 mg IV every 6 hours. 11. Metoprolol 25 mg every 6 hours.  SOCIAL HISTORY: The patient has [facility] since late February. He has history of extensive tobacco abuse, but has not had any tobacco products since his stroke.  There is also history of prior heavy alcohol consumption.   FAMILY HISTORY: The patient's mother died secondary to cerebral aneurysm. The patient's father apparently had coronary artery disease.   REVIEW OF SYSTEMS: Currently unobtainable from the patient given his history of hemorrhagic stroke and nonverbal status.   PHYSICAL EXAMINATION:   VITAL SIGNS: Temperature 96.5, pulse 96,  respirations 30, blood pressure 96/61, pulse oximetry 100%.   GENERAL: Frail, cachectic, elderly African American male.   HEENT: Normocephalic, atraumatic. Eyes are closed. When  eyes were opened manually, the patient had pupils that were 2 mm bilaterally and minimally reactive, bilateral arcus senilis noted. Difficult to assess his hearing. No epistaxis noted. Oral mucosa are very dry.   NECK: Supple without JVD or lymphadenopathy. Tracheostomy noted to be in place and appears to be secure at present.   LUNGS: Coarse rhonchi bilaterally with mildly increased work of breathing.   HEART: S1, S2 irregular. No murmurs or rubs appreciated.   ABDOMEN: Soft, nontender, and nondistended. Bowel sounds present. There is a PEG tube in place. There is also a midline abdominal scar.   EXTREMITIES: No clubbing, cyanosis, or edema noted.   NEUROLOGIC: The patient spontaneously moves his right upper extremity. His eyes are closed. He is not following any commands.   SKIN: Warm and dry. Diminished skin turgor noted.   MUSCULOSKELETAL: No joint redness, swelling, or tenderness appreciated.   GU: No suprapubic tenderness is noted at this time.   PSYCHIATRIC: Unable to assess at this time.   LABS/STUDIES: Glucose 359.   ABG shows pH 7.47, pCO2 35, pO2 101, FiO2 50%.   CBC shows WBC 25, hemoglobin 11.7, hematocrit 36, and platelets 170. BMP shows sodium 159, potassium 4.2, chloride 120, CO2 26, BUN 85, creatinine 1.51, and glucose 317. Troponin 0.17.   CT of the head without contrast shows evolving hemorrhagic infarct on the right.   Chest x-ray shows left lower lobe pneumonia.   Urinalysis shows urine glucose greater than 500 mg/dL, but otherwise is unremarkable.   Urine culture thus far is negative.  IMPRESSION: This is a 81 year old African American male with past medical history of diabetes mellitus, hypertension, benign prostatic hypertrophy, abdominal aortic aneurysm status post repair at [facility] in 2005, paroxysmal atrial fibrillation, vascular dementia, tobacco abuse, and right temporal frontoparietal hemorrhagic cerebrovascular accident in January of 2013 status post  tracheostomy and PEG tube placement who presented to [facility] with decreased level of consciousness. The is patient now found to have left lower lobe pneumonia, atrial fibrillation, hyperglycemia, systemic inflammatory response syndrome, acute renal failure, and hypernatremia.   PROBLEM LIST:  1. Acute renal failure.  2. Hypernatremia.  3. Left lower lobe pneumonia.  4. Systemic inflammatory response syndrome.  5. History of right temporoparietal hemorrhagic cerebrovascular accident status post PEG and trach placement.   PLAN: The patient presents with a very severe illness at this point in time. He is notably hypernatremic with a serum sodium of 159. He is currently on half normal saline and serum sodium has risen despite the use of half-normal saline. Therefore, we will need to discontinue half-normal saline in favor of D5W. The patient is noted to be hyperglycemic, and he will likely need to be placed on the insulin protocol, per the Critical Care Unit. We will need to monitor serum sodium over time. In regards to his acute renal failure, this appears to be secondary to SIRS, pneumonia, and subsequent dehydration. We plan to obtain renal ultrasound, SPEP, and UPEP for further evaluation. No acute indication for dialysis as the BUN does appear to be coming down at present. Continue IV fluid hydration as above. In regards to his left lower lobe pneumonia, I agree with the use of Zyvox and Zosyn as the patient could be considered as having healthcare-associated pneumonia since he has been institutionalized since his recent stroke.  Overall, however, the patient's prognosis appears to be quite poor at this point in time, and I agree with palliative care evaluation.   I would like to thank Dr. Chales Abrahams for this kind referral. Further plan as the patient progresses.  ____________________________ Lennox Pippins, MD mnl:slb D: 11/22/2011 12:50:22 ET T: 11/22/2011  13:17:47 ET JOB#: [id number]  cc: Lennox Pippins, MD, <Dictator> Ria Comment Kindle Strohmeier MD ELECTRONICALLY SIGNED 12/06/2011 0:00

## 2015-01-12 NOTE — Consult Note (Signed)
General Aspect 81 year old male who has history of coronary artery disease with previous coronary artery bypass graft, diabetes, hypertension, history of abdominal aortic aneurysm status post repair [facility], atrial fibrillation noted on his last admission, vascular dementia, hemorrhagic stroke on 10/13/2011.Cardiology was consulted for elevated troponin, tachycardia.  Yesterday AM, he pulled out his trach and he was without oxygen for a short duration. it was replaced back at the facility. When he came to the Emergency Room he was noted to have a low-grade fever,  yellowish-brown phlegm via the trach and the mouth. His initial blood work in the Emergency Room noted to have a white count of 23.9. CXR and exam suggesting left lower lobe pneumonia. His BUN was found to be 90 with a creatinine of 1.66 and a sodium of 155. Admitted with sepsis, pneumonia and altered mental status, only responsive to painful stimulus.  patient is nonverbal since he had the stroke. and he has been receiving all his nutrition through the PEG tube. Blood cultures were drawn in the Emergency Room and patient already got a dose of vancomycin and Zosyn in the Emergency Room. 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 No other history is available at this time.   This Am, converted to atrial fib with RVR.  Increase rate of respiration.    His CT of the head here showed patient had a large temporoparietal hemorrhage and bilateral ventricular hemorrhage, prominent compression of the right lateral ventricle and the third ventricle. [facility] and currently he is trached and PEG???d. [facility] was [facility] about [facility] ago.    Present Illness . SOCIAL HISTORY: [facility]. He has been a heavy smoker until his hemorrhagic stroke on 01/23. He also was drinking heavily before that. He used to live  with his [place] and daughter in [place] before.   FAMILY HISTORY:  family history of hypertension and diabetes and stroke in sister.   Physical Exam:   GEN thin, He sometimes opens his eyes. He cannot follow any commands    HEENT pink conjunctivae    NECK supple    RESP rhonchi  tachypnea    CARD Tachycardic    ABD denies tenderness  Thin    LYMPH negative neck    EXTR negative edema    SKIN normal to palpation    NEURO unable to participate    PSYCH poor insight, lethargic   Review of Systems:   ROS Pt not able to provide ROS     CVA/Stroke:    AAA - Abdominal Aortic Anuerysm:    Diabetes Mellitus, Type II (NIDD):    htn:    AAA - Abdominal Aortic Aneurysm Repair:        Admit Diagnosis:   UREMIA CVA: 22-Nov-2011, Active, UREMIA CVA  Home Medications: Medication Instructions Status  aspirin 81 mg oral tablet 1 tab(s) orally once a day Active  metoprolol tartrate 50 mg oral tablet 1 tab(s) orally 2 times a day Active  nicotine 14 microgram(s) topically once a day Active  Lipitor 20 mg oral tablet 1 tab(s) orally once a day  Active  Risperdal 0.5 mg oral tablet tab(s) orally once a day night Active     Routine Hem:  04-Mar-13 06:35    WBC (CBC) 25.0   RBC (CBC) 3.77   Hemoglobin (CBC)  11.7   Hematocrit (CBC) 36.3   Platelet Count (CBC) 170   MCV 96   MCH 31.1   MCHC 32.3   RDW 15.9   Neutrophil % 94.1   Lymphocyte % 3.5   Monocyte % 2.3   Eosinophil % 0.1   Basophil % 0.0   Neutrophil # 23.5   Lymphocyte # 0.9   Monocyte # 0.6   Eosinophil # 0.0   Basophil # 0.0  Blood Glucose:  04-Mar-13 07:52    POCT Blood Glucose 273  Lab:  04-Mar-13 08:45    pH (ABG) 7.47   PCO2 35   PO2 101   FiO2 50   Base Excess 2.1   HCO3 25.5   O2 Saturation 98.2   Patient Temp (ABG) 37.0   Specimen Type (ABG) ARTERIAL   EKG:   Interpretation EKG shows sinus tachycardia with rate 121 bpm,  Tele strips starting 7:23 am showing atrial fib with RVR    Radiology Results: XRay:    03-Mar-13 15:47, Chest Portable Single View   Chest Portable Single View    REASON FOR EXAM:    dyspnea  COMMENTS:       PROCEDURE: DXR - DXR PORTABLE CHEST SINGLE VIEW  - Nov 21 2011  3:47PM     RESULT:     Tracheostomy tube noted. Left lower lobe infiltrate is noted consistent   with pneumonia. The right lung is clear. Cardiomegaly is noted. Pulmonary   vascularity is normal. COPD cannot be excluded.    IMPRESSION:  Tracheostomy tube noted. Left lower lobe infiltrate   consistent with pneumonia.      Thank you for this opportunity to contribute to the care of your patient.           Verified By: Gwynn Burly, M.D., MD    No Known Allergies:   Vital Signs/Nurse's Notes: **Vital Signs.:   04-Mar-13 06:39   Temperature Temperature (F) 97.7   Celsius 36.5   Temperature Source axillary   Pulse Pulse 115   Pulse source per Dinamap   Respirations Respirations 18   Systolic BP Systolic BP 116   Diastolic BP (mmHg) Diastolic BP (mmHg) 64   Mean BP 81   BP Source Dinamap   Pulse Ox % Pulse Ox % 90   Pulse Ox Activity Level  At rest   Oxygen Delivery Trach Aerosol Mask     Impression 81 year old male who has history of coronary artery disease with previous coronary artery bypass graft, diabetes, hypertension, history of abdominal aortic aneurysm status post repair at [facility] in 2005, atrial fibrillation noted on his last admission, vascular dementia, hemorrhagic stroke on 10/13/2011.Cardiology was consulted for elevated troponin, tachycardia.  A/P: 1)Arrhythmia/atrial fibrillation appeared to have started this Am Rapid rate Talked to Dr. Hilton Sinclair, would start amiodarone ggt could also add digoxin if rate does not improve. --BP holding parameters on metoprolol  2) Elevated cardiac enz: Suspect secondary to rapid heart rate in setting of underlying CAD medical management of rate for now Not a candidate for anticoagulation at this  time.  3) CVA: dementia preior to CVA,  Severely disabled at this time.  4) CAD/CABG Minimal elevation in TNT at this time.   5) Sepsis: Pneumonia, LLL on ABX Low BP  Critical condition   Electronic Signatures: Julien Nordmann (MD)  (Signed 04-Mar-13 10:06)  Authored: General Aspect/Present Illness, History and Physical Exam, Review of System, Past Medical History, Health Issues, Home Medications, Labs, EKG , Radiology,  Allergies, Vital Signs/Nurse's Notes, Impression/Plan   Last Updated: 04-Mar-13 10:06 by Julien Nordmann (MD)

## 2015-01-12 NOTE — Consult Note (Signed)
PATIENT NAME:  Dylan Cohen, Dylan Cohen MR#:  [id number] DATE OF BIRTH:  24-Aug-1933  DATE OF CONSULTATION:  10/10/2011  REFERRING PHYSICIAN:   CONSULTING PHYSICIAN:  Madolyn FriezeBrian S. Ronya Gilcrest, MD  HISTORY OF PRESENT ILLNESS: The patient is a 81 year old male with past medical history of coronary artery disease, hypertension, diabetes mellitus, benign prostatic hypertrophy, abdominal aortic aneurysm status post repair, dementia who I am asked to evaluate for atrial fibrillation and elevated troponin. The patient is [facility]. He lives with his wife and has significant dementia. At present he does not know what year it is or location. He does know his name. He denies dyspnea, chest pain, palpitations, or syncope. Apparently yesterday the patient wondered out of his house. He was eventually found in a ditch with resultant left facial abrasion. He was brought to the Emergency Room and noted to have atrial fibrillation. Cardiology is now asked to further evaluate.   PAST MEDICAL HISTORY:  1. Coronary artery disease.  2. Per his daughter he had a myocardial infarction in the late 90s and had angioplasty at that time. I do not have those records available.  3. He also has hypertension and hyperlipidemia.  4. Based on the hospitalist history and physical there is description of diabetes mellitus but the family denies this.  5. He also has benign prostatic hypertrophy. 6. He has had a previous abdominal aortic aneurysm repair.  7. There are no other surgeries noted.   HOME MEDICATIONS: His home medications are unclear. Per the history and physical he was on:  1. Metoprolol 50 mg every six hours. 2. Felodipine 10 mg daily. 3. Lisinopril 40 mg daily p.o., daily. 4. Metformin 500 mg p.o. daily. 5. Trazodone 50 mg p.o. at bedtime p.r.n.  6. Terazosin 5 mg p.o. at bedtime. 7. Tylenol and aspirin 325 mg p.o. daily.  SOCIAL HISTORY: He does smoke. He occasionally consumes alcohol if it is given  to him per his daughter's report. He lives with his wife and daughter.   FAMILY HISTORY: His family history is positive for coronary disease in his daughter.   REVIEW OF SYSTEMS: Unobtainable based on the patient's history but he denies any orthopnea, PND, pedal edema, melena, fevers, or chills, hemoptysis, or productive cough. Remaining systems are negative.   PHYSICAL EXAMINATION: VITAL SIGNS: Physical examination today shows temperature 97.9, blood pressure 162/91, pulse 70 and regular.   GENERAL: He is well developed and frail and does not appear to be in acute distress.   SKIN: Warm and dry.   PSYCH: He does not appear to be depressed although severely demented.   MUSCULOSKELETAL: There is no peripheral clubbing.   BACK: His back is normal.   HEENT: Significant for an abrasion in the left orbital area from his fall. It is otherwise normal.   NECK: His neck is supple with a normal upstroke. I cannot appreciate bruits.   CHEST: Clear to auscultation. Normal expansion.   CARDIOVASCULAR: Reveals regular rate, rhythm. Normal S1 and S2. I cannot appreciate murmurs, rubs, gallops.   ABDOMEN: Not tender or distended. Positive bowel sounds. No hepatosplenomegaly. No masses appreciated. There are no bruits palpated.   PERIPHERIES: He has 2+ femoral pulses bilaterally. No bruits.   EXTREMITIES: Extremities show no edema that I could palpate. No cords. He has 2+ dorsalis pedis pulses bilaterally.  NEUROLOGICAL: His neurologic exam is significant for severe dementia. He does move all extremities.   LABORATORY, DIAGNOSTIC AND RADIOLOGICAL DATA: His laboratories show sodium  145, potassium 3.6. BUN/creatinine 22 and 0.99 respectively. Alcohol level is less than 3. Liver functions are normal. Troponin 0.08. White blood cell count 7.2 with a hemoglobin 15.6, hematocrit 46.9. Platelet count 120. His head CT shows chronic and involutional changes without evidence of focal or acute abnormalities.  Chest x-ray shows chronic obstructive pulmonary disease without evidence of acute cardiopulmonary disease. His electrocardiogram shows atrial fibrillation at a rate of 80. There is left axis deviation. There is an RV conduction delay. There is a prior anterior infarct with nonspecific ST changes. The patient did convert to sinus rhythm.   DIAGNOSES.: 1. Paroxysmal atrial fibrillation-This appears to be a new diagnosis. The family is unaware of previous bouts of atrial fibrillation. Note: His rate was controlled when he was in the atrial fibrillation and as far as I can tell he has no symptoms including no chest pain, shortness of breath. He is now back in sinus rhythm. I would recommend continuing his beta blocker for rate control if his atrial fibrillation recurs. It would be worthwhile to check a TSH. He does have embolic risk factors. However, he has significant dementia and fell prior to this admission. I think the risk of Coumadin, Pradaxa or Xarelto far outweighs the benefit. We would recommend continuing aspirin. I do not think he requires heparin in hospital. An echocardiogram has been ordered to quantify LV function.  2. Mild elevation in troponin-His troponin was 0.08. The patient is not having chest pain. As stated previously, he has severe dementia and I raised the issue of CODE STATUS with his family. I would tend to not be aggressive with procedures in this gentleman given his dementia. I discussed this with his family. Instead, we would recommend medical therapy.  3. History of coronary disease-He will continue on his aspirin and statin.  4. Hypertension-His blood pressure will be followed and medications adjusted as needed.  5. History of abdominal aortic aneurysm repair.  6. Severe dementia-Not a candidate for aggressive cardiac evaluation.   ____________________________ Madolyn Frieze Jens Som, MD bsc:cms D: 10/10/2011 11:39:01 ET T: 10/10/2011 12:02:27 ET  JOB#: [id number] cc: Madolyn Frieze.  Jens Som, MD, <Dictator> Lewayne Bunting MD ELECTRONICALLY SIGNED 10/14/2011 7:45

## 2015-01-12 NOTE — Consult Note (Signed)
Brief Consult Note: Diagnosis: dilated extra renal pelvis- no significant hydronephrosis.   Patient was seen by consultant.   Consult note dictated.  Electronic Signatures: Riki AltesStoioff, Aribelle Mccosh C (MD)  (Signed [id number] 170905-Mar-13 18:26)  Authored: Brief Consult Note   Last Updated: 05-Mar-13 18:26 by Riki AltesStoioff, Riona Lahti C (MD)

## 2015-01-12 NOTE — H&P (Signed)
PATIENT NAME:  Dylan Cohen, Dylan Cohen MR#:  [id number] DATE OF BIRTH:  [birth year]  DATE OF ADMISSION:  11/21/2011  ADDENDUM:  I'm going to hold Diflucan on the patient as the patient has elevated liver function tests. Also, the patient is getting high protein tube feedings continuously at the skilled nursing facility which could also be contributing to uremia. Dietician has been called to review his tube feeding. I'm going to increase his free water to help his hyeronatremia.   ____________________________ Dylan SorrowAbhinav Kahlen Morais, Dylan Cohen ag:drc D: 11/21/2011 18:01:32 ET T: 11/22/2011 05:45:26 ET JOB#: [id number]  cc: Dylan SorrowAbhinav Marilin Kofman, Dylan Cohen, <Dictator> Dylan SorrowABHINAV Albana Saperstein Dylan Cohen ELECTRONICALLY SIGNED 12/19/2011 14:50

## 2015-01-12 NOTE — H&P (Signed)
PATIENT NAME:  Dylan Cohen, Dylan Cohen MR#:  [id number] DATE OF BIRTH:  1933-02-22  DATE OF ADMISSION:  12/06/2011  PRIMARY CARE PHYSICIAN: [facility]  ED REFERRING PHYSICIAN: Dr. Manson PasseyBrown   CHIEF COMPLAINT: The patient was sent from the nursing facility due to dislodged tracheostomy tube.   HISTORY OF PRESENT ILLNESS: The patient is a 81 year old African American male who had a severe hemorrhagic stroke in January of this year. [facility] and he underwent tracheostomy tube placement. The patient was recently hospitalized here in March on 11/21/2011 with decrease in responsiveness, SIRS. At that time he was diagnosed with MRSA pneumonia and was discharged on Zyvox therapy. The patient was sent back from the skilled nursing facility because his tracheostomy tube fell out. He had no airway compromise since returning to the ED. He was saturating near 100% on trach collar 4-0 endotracheal tube through his stoma. The ED physician was unable to get trach tube back in. He was seen by Dr. Linus Salmonshapman McQueen of ENT who removed the endotracheal tube.  A #69 cuffed, nonfenestrated Shiley was pushed through the stoma back into the airway. The patient here also was noted to have an elevated WBC count of 19,000 as well as a chest x-ray which showed a volume loss and increased density within the left hemithorax suggesting either mucus plugging or pneumonia. We were asked to admit the patient. The patient is unable to give me any history. Currently, he is wake and not in any distress.   PAST MEDICAL HISTORY:  1. Coronary artery disease with coronary artery bypass graft.  2. Hemorrhagic stroke leading to PEG and trach.  3. Diabetes.  4. Hypertension.  5. Benign prostatic hypertrophy.  6. History of abdominal aortic aneurysm status post [facility] in [facility].  7. Paroxysmal atrial fibrillation.  8. Vascular dementia.  9. Previous history of tobacco abuse.  10. Recent hospitalization for MRSA  pneumonia.   PAST SURGICAL HISTORY:  1. Coronary artery disease.  2. Abdominal aortic aneurysm. 3. Tracheostomy. 4. PEG.  ALLERGIES: None.   CURRENT MEDICATIONS:  1. Amlodipine 10, 1 tab via G-tube daily.  2. Aspirin 81 mg via G-tube daily.  3. Docusate 10 mL via G-tube b.i.d.  4. DuoNebs,  3-mL inhalation q. 6 hours. 5. Hydrogen peroxide 3% topical solution around trach every shift.  6. Keppra 100 mg/mL oral solution, 5 mL via G-tube b.i.d.  7. Lipitor 20 mg daily.  8. Lisinopril 20 mg down G-tube daily.  9. Metoprolol tartrate 25, 1 tab via G-tube b.i.d.  10. MiraLAX 17 grams via G-tube b.i.d.  11. Novolin N 10 units subcutaneous b.i.d.  12. Tylenol Extra Strength 500 mg via G-tube q. 6 hours as needed.   SOCIAL HISTORY: The [facility] has [facility] at [facility] since this hemorrhagic cerebrovascular accident. He used to live with his wife and daughter.  FAMILY HISTORY: According to previous records, hypertension, diabetes, and stroke in sister.   PHYSICAL EXAMINATION:  VITAL SIGNS: Temperature 99, pulse 82, respirations 20, blood pressure 86/57, O2 93% via high flow on the trach.   GENERAL: The patient appears chronically ill, a very debilitated, cachectic African American male. Eyes open but does not interact.   HEENT: Head atraumatic, normocephalic. Pupils equally round, reactive to light and accommodation. No sclerae icterus. No conjunctival pallor. Oral mucosa is very dry with whitish coating in his mouth, yellowish mucus in his mouth likely from his n.p.o. status and not able to  take anything. Ear exam externally shows no erythema or drainage. Nasal exam shows no ulceration or drainage.   NECK:  No thyroid tenderness.  Neck is supple. No masses, nontender. No adenopathy. No JVD or carotid bruits. He has a trach in place. Earlier he had some drainage coming from the trach.   CHEST: Bilateral sounds are diminished with left base rhonchi. No accessory muscle usage.   HEART:  Regular rate and rhythm. No murmurs, rubs, clicks, or gallops.   ABDOMEN: Midline scar. There is a PEG tube in place. No drainage. Bowel sounds times four positive. No hepatosplenomegaly. No bruits. No masses. Nontender, nondistended.   EXTREMITIES: No clubbing, cyanosis, or edema.   SKIN:  No rash.  LYMPHATICS: No lymph nodes palpable.   PSYCHIATRIC: Unable to do.   VASCULAR: Good DP, PT pulses.   NEUROLOGIC: The patient has some movement of his extremities on the right side, left-sided hemiplegia. Eyes open, does not interact.   PERTINENT LABS/STUDIES: In the ED glucose 244, BUN 34, creatinine 1.04, sodium 142, potassium 5, chloride 101, CO2 29, calcium 8.2, bilirubin total 0.8, alkaline phosphatase is 110. ALT is a 40. AST is 43. Total protein 6.3. Albumin 1.8. WBC count 19.1, hemoglobin 12.7, platelet count 291. Chest x-ray shows volume loss and increased density within the left hemithorax; an etiology such as mucous plugging is a diagnostic consideration. Clinical correlation is recommended.   ASSESSMENT AND PLAN: The patient is a 81 year old African American male with history of hemorrhagic cerebrovascular accident, has trach and PEG tube since then. He presented to the ED with dislodged trach.  He has been seen by ENT and this has been addressed. He is noted to have elevated WBC count, left-sided possible pneumonia/mucus plugging.  1. Likely healthcare-associated pneumonia: At this time we will treat him with IV vancomycin and Zosyn. Send trach secretions for cultures. Get blood cultures. We will have pulmonary evaluation. We will continue nebulizers and high flow O2 via trach.  2. Coronary artery disease, coronary artery bypass grafting: Continue aspirin. Hold metoprolol in light of the low blood pressure.  3. Diabetes: We will place him on sliding scale insulin. His Novolin will likely need to be increased.  4. Hypertension: The patient is hypotensive. We will hold antihypertensives.   5. Benign prostatic hypertrophy: He is currently not on any treatment.  6. Vascular dementia: Likely progressive.  7. Nutrition: We will resume his tube feeds.  8. CODE STATUS: The patient is FULL CODE as previously. At this time we will have the palliative care team evaluate the patient and discuss the goals of treatment. His prognosis is very poor since his hemorrhagic stroke. This will be his third admission.  TIME SPENT: 50 minutes.  ____________________________ Lacie Scotts Allena Katz, MD shp:bjt D: 12/06/2011 13:42:12 ET T: 12/06/2011 14:51:11 ET JOB#: [id number]  cc: Carollyn Etcheverry H. Allena Katz, MD, <Dictator> Glenetta Borg, MD Charise Carwin MD ELECTRONICALLY SIGNED 12/08/2011 21:59

## 2015-01-12 NOTE — H&P (Signed)
PATIENT NAME:  Dylan Cohen, Dylan Cohen MR#:  [id number] DATE OF BIRTH:  27-Apr-1933  DATE OF ADMISSION:  11/21/2011  PRIMARY CARE PHYSICIAN: Dr. Larena Sox at [facility]; resident of [facility]  CHIEF COMPLAINT: Patient cannot provide any history at this time. Patient was sent from [facility] because of decreased level of consciousness and patient being lethargic.   HISTORY OF PRESENT ILLNESS: This is a 81 year old male who has history of coronary artery disease with previous coronary artery bypass graft, diabetes, hypertension, history of abdominal aortic aneurysm status post repair at [facility] in 2005, atrial fibrillation, vascular dementia. The patient had a hemorrhagic stroke on 10/13/2011. His CT of the head here showed patient had a large temporoparietal hemorrhage and bilateral ventricular hemorrhage, prominent compression of the right lateral ventricle and the third ventricle. Patient was transferred to [facility]. Patient remained at [facility] for about three weeks and currently he is trached and PEG'd. He was discharged to [facility] from [place] about 10 days ago. Today the family says that around 8:00 this morning he pulled out his trach and he was without oxygen or a short duration and then it was replaced back at the facility. When he came to the Emergency Room he was noted to have a low-grade fever, also noted in the records from [facility] patient is having yellowish-brown phlegm via the trach and the mouth. His initial blood work in the Emergency Room noted to have a white count of 23.9. He had a left lower lobe pneumonia. His BUN was found to be 90 with a creatinine of 1.66 and a sodium of 155. Hospitalist was asked to admit the patient because of sepsis, pneumonia and altered mental status. As per the ER notes patient was only responsive to painful stimulus. Per the family patient is nonverbal since he had the stroke. He sometimes opens his eyes. He cannot follow any commands and he has been  receiving all his nutrition through the PEG tube. Blood cultures were drawn in the Emergency Room and patient already got a dose of vancomycin and Zosyn in the Emergency Room. 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 No other history is available at this time. There is no history of nausea, vomiting, diarrhea.   REVIEW OF SYSTEMS: Review of systems is not possible as patient could not provide any history.  PAST MEDICAL HISTORY:  1. Coronary artery disease with previous coronary artery bypass graft.  2. Diabetes.  3. Hypertension. 4. Benign prostatic hypertrophy.  5. History of abdominal aortic aneurysm status post repair at [facility] in 2005.  6. Paroxysmal atrial fibrillation. 7. Vascular dementia.  8. Tobacco abuse.  9. He was last admitted to [facility] from 01/20 to 10/11/2011 because of borderline troponin, transient atrial fibrillation and accelerated hypertension. Two days later he presented with a hemorrhagic stroke. He was intubated and transferred to [facility] internal medicine where he stayed for three weeks. Now he is status post trach and PEG.   PAST SURGICAL HISTORY:  1. Coronary artery bypass graft. 2. Abdominal aortic aneurysm repair. 3. Now trach and PEG tube.   ALLERGIES TO MEDICATIONS: None.   MEDICATIONS: As per the [facility] he is n.p.o.  1. He is on high protein tube feeding at 85 mL/h continuous; flush with water 20 mL/h via pump.  2. Aspirin 81 mg via the G-tube once daily.  3. Lisinopril 20 mg via the G-tube once daily.  4. Amlodipine  10 mg via the G-tube once daily.  5. Diflucan 100 mg via the G-tube. 6. Atorvastatin 20 mg via the G-tube once daily.  7. Novolin N 10 units twice a day. 8. Levetiracetam 500 mg twice a day.  9. MiraLax 17 grams via the G-tube daily.  10. Docusate 100 mg via the G-tube twice a day.  11. Metoprolol 25 mg via the G-tube twice a day. 12. Mucomyst nebulized 20% solution t.i.d. for the  secretions. 13. DuoNebs q.i.d.   SOCIAL HISTORY: He has been at [facility] for about 10 days right now. He has been a heavy smoker until his hemorrhagic stroke on 01/23. He also was drinking heavily before that. He used to live with his wife and daughter in [place] before.   FAMILY HISTORY: According to previous records family history of hypertension and diabetes and stroke in sister.   PHYSICAL EXAMINATION:  VITAL SIGNS: When he presented to the Emergency Room: Temperature 100.2, heart rate 121, respiratory rate 30, blood pressure 132/76, saturating 96% on trach collar at 5 liters. Currently, his heart rate 116, blood pressure 118/72, saturating 98% on 5 liters on trach collar.   GENERAL: This is an elderly [place] American male well built, sick looking. He is chronically ill-appearing.   HEENT: He is keeping his eyes closed but his pupils are small, pinpoint. No scleral icterus. No conjunctivitis. Oral mucosa is dry.   NECK: No thyroid tenderness, enlargement or nodule. Neck is supple. No masses, nontender. No adenopathy. No JVD. No carotid bruit. Status post trach. No discharge.   CHEST: Bilateral breath sounds are clear but diminished in the left side. Tachypneic but not using accessory muscles of respiration.   HEART: Heart sounds are regular, tachycardic. There is a murmur. Peripheral pulses 1+. No lower extremity edema.   ABDOMEN: There is a midline scar. There is a PEG tube. Soft. Good bowel sounds. No hepatosplenomegaly. No bruit. No masses. No rigidity. No rebound.   RECTAL: Deferred.   NEUROLOGICAL: He has some movement of his extremities on the right side. He has complete left-sided hemiplegia. He has some response to painful stimulus. Very lethargic at this time. No spontaneous eye opening at this time.   EXTREMITIES: No cyanosis. No clubbing.   SKIN: Poor skin turgor.   LABORATORY, DIAGNOSTIC AND RADIOLOGICAL DATA: White count 23.9, hemoglobin 13.6, platelet count  199,000. BMP: Sodium 155, potassium 4.1, BUN 90, creatinine 1.66, glucose 470. His ALT is elevated at 169 and AST at 108. Gap of 11. His creatinine was normal, 0.67 on 10/13/2011. His INR is 1.2. His urinalysis shows ketones negative, nitrite and leukocyte esterase negative. His chest x-ray shows tracheostomy, left lower lobe infiltrate consistent with pneumonia. His EKG looks like atrial flutter flutter/atrial fibrillation. Left axis deviation, some Q waves in septal leads. T wave inversions in lateral leads. He had an echocardiogram done on 11/10/2011 which showed that he had an ejection fraction of 50% to 55%, impaired LV relaxation, moderate concentric left ventricular hypertrophy.   IMPRESSION:  1. Systemic inflammatory response syndrome as evidenced by leukocytosis, tachycardia, hypoxia, hypotension. 2. Left-sided pneumonia. 3. Acute renal failure with uremia and severe hypernatremic dehydration. 4. Uncontrolled diabetes. 5. Elevated liver function tests. 6. Elevated troponins most likely secondary to tachycardia.  7. Recent hemorrhagic stroke in 09/2011 status post trach and PEG. 8. Coronary artery disease with previous coronary artery bypass graft. 9. History of hypertension. 10. History of abdominal aortic aneurysm. 11. Rapid atrial flutter.   PLAN: A 81 year old  male who has history of coronary artery disease, diabetes, hypertension, paroxysmal atrial fibrillation, vascular dementia. He suffered a hemorrhagic stroke on 10/13/2011. [facility]. He has now trached and PEG'd. He is coming [facility] because of lethargy, decreased level of consciousness. He is noted to have a left lower side pneumonia. He is also having a white count of 23,000. He is tachycardic, hypoxic. Systemic inflammatory response syndrome, rule out sepsis. Blood cultures have been sent. Will also send a sputum culture from his trach. He is extremely dehydrated at this time with hypernatremia,  creatinine of 1.66 and BUN of 90. I am going to start him with half-normal saline. He also has uncontrolled diabetes at this time with a sugar of 470, cannot give him D5. He has been started on vancomycin and Zosyn for his pneumonia, will continue that. He has history of MRSA in the past. Will also call a nephrology consult on him considering his hypernatremia, acute renal failure and uremia. He most likely has altered mental status, encephalopathy because of his renal failure, uremia and hypernatremic dehydration with a recent intracranial bleed. Will also check an ABG on him to rule out any hypercapnia. Will check a CT of his head also. His blood pressure is borderline right now. I am going to continue his metoprolol but will hold his lisinopril and amlodipine at this time. Will hold his statin because of elevated liver function tests. Will continue low-dose aspirin. Will continue Keppra. Will go up on his insulin. Will continue his tube feeding. Dietary will be consulted. Patient has poor prognosis considering his recent stroke, his debilitated state and now with his renal failure and severe hypernatremic dehydration. I discussed CODE STATUS with the family and they want him to be FULL CODE.   TIME SPENT WITH ADMISSION AND COORDINATION: About 60 minutes.   ____________________________ Fredia SorrowAbhinav Yula Crotwell, MD ag:cms D: 11/21/2011 17:46:47 ET T: 11/22/2011 05:20:57 ET JOB#: [id number]  cc: Fredia SorrowAbhinav Jayda White, MD, <Dictator> Glenetta BorgMaria-Dorina Sevilla, MD Fredia SorrowABHINAV Zetta Stoneman MD ELECTRONICALLY SIGNED 12/19/2011 14:50

## 2015-01-12 NOTE — Consult Note (Signed)
PATIENT NAME:  Dylan Cohen, Dylan Cohen MR#:  [id number] DATE OF BIRTH:  November 12, 1932  DATE OF CONSULTATION:  11/22/2011  REFERRING PHYSICIAN:   CONSULTING PHYSICIAN:  Yevonne PaxSaadat A. Ishika Chesterfield, MD  REASON FOR CONSULTATION: Care of chronic respiratory failure.  HISTORY OF PRESENT ILLNESS: This is a 81 year old gentleman with a chronic indwelling tracheostomy, a resident [facility]. The patient has long-standing history who presented initially on 11/13/2011 with a temporoparietal hemorrhagic stroke. At the time, he was [facility] and eventually ended up getting a trach and PEG. The patient was sent [facility], however, on the day of admission, the patient was noted to have altered mental status and he apparently attempted to pull out his PEG and trach. At that time, he was noted to have a low-grade fever, and he had been also having some cough and sputum production. The patient was noted to have elevated white count, and he had a sodium of 155. The patient was significantly dehydrated with a BUN of 90 and creatinine 1.6. He is now in the Intensive Care Unit and seen at the bedside. He has the tracheostomy in place with fairly copious secretions.   PAST MEDICAL HISTORY:  1. Coronary artery disease. 2. Hypertension. 3. Diabetes. 4. Paroxysmal atrial fibrillation.  5. Dementia.   PAST SURGICAL HISTORY:  1. Abdominal aortic aneurysm repair in 2005. 2. Trach and PEG tube.  3. Coronary artery bypass graft.  ALLERGIES: No known drug allergies.   MEDICATIONS: Reviewed on the electronic medical record.   SOCIAL HISTORY: Positive for smoking in the past until about approximately January of this year.   FAMILY HISTORY: Positive for stroke and diabetes.  REVIEW OF SYSTEMS: He is not able to cooperate with a full review of systems.  PHYSICAL EXAMINATION:  VITALS: At the time he is seen, his temperature is 96, pulse 114, respiratory rate 30, blood pressure 96/61, and saturations were 99%.    NECK: Neck appeared to be supple. There was no JVD, no adenopathy, and no thyromegaly.   CHEST: Coarse breath sounds with free rhonchi. There were no rales.   CARDIOVASCULAR: S1, S2 is normal. Regular rhythm. No gallop or rub.   NEUROLOGIC: The patient was not really verbal or able to cooperate with the exam.   ABDOMEN: Soft and nontender.   MUSCULOSKELETAL: No synovitis.  LABS/STUDIES:  Imaging studies show evolving hemorrhagic infarct on the right side, on the CT of the head.  His chest x-ray showed left lower lobe infiltrate.  White count on admission was 25,000, hemoglobin 9.7, and hematocrit 36.3. The chemistries are show BUN is down to 8.5, creatinine 1.5, and glucose 317.  Most recent ABG was 7.47/35/101.   IMPRESSION:  1. Acute on-chronic respiratory failure.  2. Pneumonia.  3. History of recent stroke. 4. Tracheostomy.   PLAN: Currently the patient will be continued on full supportive care with pulmonary toilet. Continue with T-collar. He is on antibiotics for treatment of the infection, presumed to be pneumonia. Monitor his blood pressure in the Intensive Care Unit setting and use pressors if deemed necessary for map less than 65. We will continue to follow along. The patient's overall prognosis is still quite guarded.  ____________________________ Yevonne PaxSaadat A. Velmer Broadfoot, MD sak:slb D: 11/22/2011 14:00:11 ET T: 11/22/2011 14:35:22 ET JOB#: [id number]  cc: Yevonne PaxSaadat A. Kinzley Savell, MD, <Dictator> Yevonne PaxSAADAT A Mekiah Wahler MD ELECTRONICALLY SIGNED 12/30/2011 14:59

## 2015-01-12 NOTE — Consult Note (Signed)
Comments   Dylan Cohen is a 81 y/o gentleman with multiple medical problems including hemorrhagic stroke (10/13/11), CAD s/p CABG, DM, HTN, AAA s/p repair (2005), paroxysmal afib, vascular dementia, BPH who is now admitted w/ uremia/CVA. Patient is not able to provide ROS. He is non-verbal. He recently was hospitalized at [facility] for hemorrhagic stroke for ~ 3 weeks and transferred to [facility] 10 days ago. He has a trach and PEG. Patient was emergently transferred to CCU 2/2 afib, RVR, and tachypneic. No family currently available. I have left message for wife and daughter. Will try to set up family meeting.  Vital Signs:  Nursing Vital Signs: **Vital Signs.:   04-Mar-13 11:09   Vital Signs Type Upon Transfer   Temperature Temperature (F) 96   Celsius 35.5   Temperature Source tympanic   Pulse Pulse 114   Respirations Respirations 32   Systolic BP Systolic BP 96   Diastolic BP (mmHg) Diastolic BP (mmHg) 61   Mean BP 72   Pulse Ox % Pulse Ox % 99   Pulse Ox Activity Level  At rest   Oxygen Delivery Trach Aerosol Mask   Pulse Ox Heart Rate 56   Electronic Signatures for Addendum Section:  Rey, Raquel Investment banker, corporate) (Signed Addendum 04-Mar-13 12:46)  Met with wife and daughter. They described patient's health status post CVA as very fragile. His condition has significantly declined. We discussed code status and they would like him to remain a full code at this time. We will continue to follow patient during hospitalization.   Electronic Signatures: Charolette Forward Investment banker, corporate)  (Signed 04-Mar-13 12:07)  Authored: Palliative Care, Vital Signs   Last Updated: 04-Mar-13 12:46 by Charolette Forward (RN)

## 2015-01-12 NOTE — H&P (Signed)
PATIENT NAME:  Dylan Cohen, Dylan Cohen MR#:  [id number] DATE OF BIRTH:  Apr 19, 1933  DATE OF ADMISSION:  10/10/2011  REFERRING PHYSICIAN: Dr. Dolores Cohen  PRIMARY C[facility] PHYSIC[facility].   PRESENTING COMPLAINT: Patient was found in the ditch by a family member.   HISTORY OF PRESENT ILLNESS: Mr. Dylan Cohen is a 81 year old gentleman with history of coronary artery disease status post coronary artery bypass graft, hypertension, diabetes, benign prostatic hypertrophy, abdominal aortic aneurysm status post repair who is accompanied by his sister-in-law in the room. He himself is not able to provide history. Patient has dementia. His sister-in-law reports that he is at his baseline mentation that they felt that he did not have any worsening confusion. It is normal for him to wander off but typically it is during the day and he usually remains in the area of their yard. His sister-in-law lives two houses down. He lives with his wife and daughter. Apparently wife is dealing with the flu. She fell asleep and daughter was in the bathroom when this occurred. He was found at a distance from his house in a ditch with resultant left facial abrasion and upper lip. His sister-in-law cannot provide a good history. All of medical history is obtained per previous admission from 2006. He himself denies complaints and review of systems is unreliable and sister-in-law is not aware of any history of atrial fibrillation and reports that typically he does not complain.   PAST MEDICAL HISTORY: Past medical history from previous record. 1. Hypertension.  2. Coronary artery disease status post coronary artery bypass graft.  3. Diabetes.  4. Benign prostatic hypertrophy.  5. Abdominal aortic aneurysm status post [facility].    PAST SURGICAL HISTORY: As above.   ALLERGIES: No known drug allergies.   MEDICATIONS: Sister-in-law reports that he is currently only taking aspirin 81 mg daily, however, based on his  admission back in July 2006 medications included:  1. Metoprolol 50 mg every six hours.  2. Felodipine 10 mg daily.  3. Lisinopril 40 mg daily.  4. Metformin 500 mg daily.  5. Trazodone 50 mg at bedtime as needed. 6. Terazosin 5 mg at bedtime.  7. Tylenol as needed.  8. Aspirin 325 mg daily   SOCIAL HISTORY: He smokes 1/2 pack per day. Apparently used to drink heavily, not anymore. He lives [place] and [place] in [place].    FAMILY HISTORY: hypertension, diabetes. His sister also had stroke.   REVIEW OF SYSTEMS: As per history of present illness otherwise unreliable as patient is demented at baseline.    PHYSICAL EXAMINATION:  VITAL SIGNS: Temperature 97.4, pulse initially 100, current pulse 72 in sinus, respiratory rate 18, blood pressure initially 168/107, sating at 99% on room air.   GENERAL: Lying in bed in no apparent distress.   HEENT: Normocephalic, atraumatic. Pupils equal and constricted. He has muddy sclerae. Nares without discharge. Moist mucous membrane. He has got injury and abrasion to his lips and left face and cheek.   NECK: Soft and supple. No adenopathy or JVP.   CARDIOVASCULAR: Non-tachy, regular. No murmurs, rubs, or gallops.   LUNGS: Clear to auscultation bilaterally. No use of accessory muscles or increased respiratory effort.   ABDOMEN: Soft, nontender, positive bowel sounds. No mass appreciated.   EXTREMITIES: No edema. Dorsal pedis pulses intact.   MUSCULOSKELETAL: No joint effusion. No pain on manipulation of his extremities.   NEUROLOGICAL: He has symmetrical squeeze. No focal deficits.   PSYCH: He is  alert but not oriented.   LABORATORY, DIAGNOSTIC AND RADIOLOGICAL DATA: Glucose 155, BUN 22, creatinine 0.99, sodium 145, potassium 3.6, chloride 104, carbon dioxide 28, calcium 9.4. LFTs within normal limits. Alcohol level less than 3. WBC 7.2, hemoglobin 15.6, hematocrit 46.9, platelet 120, MCV 95, troponin 0.08. Urinalysis with specific gravity  1.001, blood 2+, pH 6, negative leukocyte esterase, negative nitrite, RBC less than 1 per high-power field, WBC 1 per high-power field. CT of the head with no acute findings. There is multiple small chronic infarcts bilateral cerebellum, bilateral thalami and bilateral basal ganglia. Chest x-ray without infiltrate.   ASSESSMENT AND PLAN: Mr. Dylan Cohen is a 81 year old gentleman with history of Alzheimer's dementia, hypertension, coronary artery disease status post bypass, diabetes, aortic aneurysm status post repair, distant alcohol abuse, tobacco abuse presenting from home after being found down in a ditch.  1. Elevated troponin with questionable new onset atrial fibrillation. Will get records from [place]. Has history of coronary disease and coronary artery bypass graft and unknown if history of atrial fibrillation. Family unable to provide a good history. He is currently in normal sinus rate and rate controlled. Will resume aspirin, start on beta blocker, statin and heparin drip. Will continue oxygen and nitroglycerin sublingual as needed. Send TSH, fasting lipid panel, A1c and mag level. His urinalysis is clean. Chest x-ray is clean. Will obtain cardiology consultation. Continue on tele and cycle his cardiac enzymes.  2. Hypertension, accelerated. Beta blocker as above and follow his blood pressure. Based on his previous records may need to slowly reinitiate his antihypertensive medications. Again awaiting records from the [place].  3. Dementia. Based on CT scan likely vascular. Family denies any worsening from his baseline. Apparently has history of wandering off due to his dementia.  4. Thrombocytopenia. Probably with his history of alcohol abuse. Will need follow up with PCP if persistent.  5. Tobacco abuse. Nicotine patch.  6. Diabetes. As above A1c and sliding scale insulin.  7. Prophylaxis with heparin drip, aspirin and Protonix.   TIME SPENT: Approximately 45 minutes spent on patient care.     ____________________________ Dylan Derby, MD ap:cms D: 10/10/2011 06:29:17 ET T: 10/10/2011 09:24:34 ET JOB#: [id number]  cc: Pearlean Brownie Kairos Panetta, MD, <Dictator> [facility]  [facility] MD ELECTRONICALLY SIGNED 10/23/2011 23:24

## 2015-01-12 NOTE — Op Note (Signed)
PATIENT NAME:  Dylan Cohen, Rayjon E MR#:  [id number] DATE OF BIRTH:  July 24, 1933  DATE OF PROCEDURE:  11/23/2011  PREOPERATIVE DIAGNOSES:  1. Lack of appropriate IV access.  2. Multisystem organ failure.  3. Respiratory failure with pneumonia.  4. Malnutrition.   POSTOPERATIVE DIAGNOSES: 1. Lack of appropriate IV access.  2. Multisystem organ failure.  3. Respiratory failure with pneumonia.  4. Malnutrition.   PROCEDURES PERFORMED:  1. Insertion of right supraclavicular central line.  2. Ultrasound-guided insertion of venous access.   PROCEDURE PERFORMED BY: Renford DillsGregory G. Alassane Kalafut, MD   DESCRIPTION OF PROCEDURE: The patient is in the Intensive Care Unit. He is critically ill and does not have adequate IV access to maintain his parenteral medications. He is, therefore, undergoing placement of a central line. Initial evaluation demonstrated both jugular veins are occluded. He is cachectic and is at high risk for a subclavian procedure, however, ultrasound evaluation on the right does demonstrate that the supraclavicular approach to the innominate vein is viable.   The patient is then prepped and draped in the right neck and supraclavicular area. Ultrasound is placed in a sterile sleeve. Ultrasound is being utilized secondary to lack of appropriate landmarks and to avoid another visceral injury. Under direct ultrasound visualization, image is recorded of the vein. Vein is noted to be echolucent and compressible indicating patency. Under direct real-time visualization after 1% lidocaine has been infiltrated in the soft tissues, a micropuncture needle is inserted into the vein, blood is returned on aspiration, and a microwire is advanced without difficulty. Micro sheath is then inserted. J-wire is then advanced followed by the dilator and then the triple-lumen catheter. Triple-lumen catheter is positioned with approximately 4 cm outside the skin and the butterfly adapter is used to secure the catheter to the  skin at the insertion site. The hub is then secured into the neck slightly higher up and a sterile dressing is applied. 2-0 silk suture is used to secure both the butterfly and the hub itself as usual. All three lumens aspirate easily and flush well and the patient is in an unchanged condition at the conclusion of the procedure.   ____________________________ Renford DillsGregory G. Kinley Ferrentino, MD ggs:drc D: 11/24/2011 09:11:28 ET T: 11/24/2011 09:48:36 ET JOB#: [id number]  cc: Renford DillsGregory G. Crystallynn Noorani, MD, <Dictator> Renford DillsGREGORY G Junell Cullifer MD ELECTRONICALLY SIGNED 11/26/2011 10:48

## 2015-01-12 NOTE — Consult Note (Signed)
PATIENT NAME:  Phillips GroutBOLDEN, Timathy E MR#:  [id number] DATE OF BIRTH:  Oct 21, 1932  DATE OF CONSULTATION:  10/10/2011  REFERRING PHYSICIAN:  Alford Highlandichard Wieting, MD CONSULTING PHYSICIAN:  Cammy CopaPaul H. Ascencion Stegner, MD  REASON FOR CONSULTATION:   To evaluate the upper lip because of injury.   HISTORY OF PRESENT ILLNESS: The patient is a 81 year old African American male who has was found lying in a ditch after falling yesterday. He has been fairly confused and definitely has had to be watched, but his wife was dealing with the flu and fell asleep and his daughter was in the bathroom and he ended up leaving the house. He apparently wandered and fell. Do not know if he lost consciousness, but he was alert enough when he was brought into the hospital. He denied any complaints, but his review of systems was definitely unreliable. He did have some injury to his upper lip and assessment is made of this today to see if it needs any stitches.   PAST MEDICAL HISTORY:  1. Hypertension. 2. Coronary artery disease, status post coronary artery bypass graft. 3. Diabetes. 4. Benign prostatic hypertrophy.  5. Abdominal aortic aneurysm status post [facility].   DRUG ALLERGIES: None known.   CURRENT MEDICATIONS: As noted in the chart and reviewed. He has been on aspirin daily. He was started on heparin when he came in the hospital because of possible cardiac problems and that is when things started to ooze a little bit more. He has had that stopped this morning and all the bleeding has stopped now.   SOCIAL HISTORY: He smokes 1/2 pack a day. He has a history of heavy alcohol abuse, but not any more.   FAMILY HISTORY: Significant for hypertension, diabetes and his sister had a stroke.   REVIEW OF SYSTEMS: He is an unreliable historian and is demented, not answering questions well right now.   PHYSICAL EXAMINATION: NOSE: Open and clear.   OROPHARYNX: It looks more like an abrasion to his midline upper lip and  extending to the left side. He has a slight cut through the vermilion on the left where you could see a little granulation tissue there, but it is reasonably together. It is not bleeding at this time and I do not think sutures would be beneficial. His teeth seem to be lined up okay without injury. He will not open his mouth real wide so I cannot see the back of his throat.   NECK: Negative for any nodes or masses.   IMPRESSION/RECOMMENDATIONS: He has had injury to his upper lip, but I do not see anything that would require sutures at this time. It should heal just as well without that. He is off the anticoagulants now and he has stopped bleeding. It should scab over. I have spoken with his wife and daughter and they can use some Vaseline over this to help keep it a little bit moist and allow it to heal better. He does not need any particular follow-up, but if he should have further problems they can let me know and we will re-evaluate.  ____________________________ Cammy CopaPaul H. Caliber Landess, MD phj:ap D: 10/10/2011 10:59:29 ET T: 10/10/2011 12:40:40 ET JOB#: [id number]  cc: Cammy CopaPaul H. Jorey Dollard, MD, <Dictator> Cammy CopaPAUL H Melita Villalona MD ELECTRONICALLY SIGNED 10/11/2011 18:45

## 2015-01-12 NOTE — Discharge Summary (Signed)
PATIENT NAME:  Dylan Cohen, Dylan Cohen MR#:  [id number] DATE OF BIRTH:  [birth year]  DATE OF ADMISSION:  11/21/2011 DATE OF DISCHARGE:  11/29/2011  ADMITTING DIAGNOSIS:  1. Sepsis.  2. Atrial fibrillation/rapid ventricular response. 3. Respiratory failure.   DISCHARGE DIAGNOSES:  1. Systemic inflammatory response syndrome due to methicillin-resistant Staphylococcus aureus pneumonia.  2. Atrial fibrillation/rapid ventricular response, converted to normal sinus rhythm now.  3. Acute on chronic respiratory failure due to pneumonia.  4. Elevated troponin, no acute coronary syndrome but likely demand ischemia.  5. Dehydration.  6. Hypernatremia, resolved.  7. Acute renal failure, resolved.  8. Hypokalemia. 9. Hypoglycemia due to insulin, resolved.  10. Clostridium difficile colitis with diarrhea, resolved.  11. Malnutrition.  12. History of coronary artery disease, status post coronary artery bypass graft. 13. Diabetes mellitus. 14. Hypertension. 15. Benign prostatic hypertrophy. 16. Abdominal aortic aneurysm, status post repair in 2005.  17. Paroxysmal atrial fibrillation.  18. Vascular dementia.  19. Tobacco abuse.  20. 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  21. Status post percutaneous endoscopic gastrostomy tube and endotracheal tube placement in the past for dysphagia.   DISCHARGE CONDITION: Guarded.   PROCEDURE: Status post right supraclavicular central line placement on 11/23/2011 by Dr. Gilda CreaseSchnier.   DISCHARGE MEDICATIONS: The patient is to resume his outpatient medications which are:  1. Aspirin 81 mg per PEG tube daily.  2. Lipitor 20 mg per PEG tube daily.   ADDITIONAL MEDICATIONS:  1. Tylenol 650 mg per G-tube every 4 hours as needed.  2. Keppra solution 500 mg per G-tube twice daily.  3. Linezolid 600 mg per G-tube twice daily for 2 more days.  4. Flagyl 500 mg per G-tube 3 times daily for 16 days, which is 14 days after last  antibiotic dose.  5. Sliding scale insulin.  6. Amiodarone 200 mg p.o. G-tube twice daily.  7. Metoprolol 25 mg per G-tube twice daily.  8. Zantac 150 mg per G-tube twice daily.  9. Novolin N 10 mg subcutaneously twice daily.  10. MiraLax 17 grams per G-tube daily as needed.  11. Mucomyst 20% solution via inhalation 3 times daily.  12. DuoNebs 3 mL via inhaler 4 times daily.     HOME OXYGEN: 35% through trach aerosol mask. Wean down to room air as tolerated.   DIET: Jevity 1.5 calorie RTH 56 mL/hr continuous infusion per G-tube with flushes of water at 70 mL/hr. Check residuals and document ins and outs and flow sheets every 6 hours. Make sure that the patient's head of bed is elevated more than 35 degrees at all times. Hold feedings if residuals are more than 200 mL. Restart feedings in 2 hours if the residuals are less than 100 mL. If tolerating, increase to goal rate of 56 mL after 8 hours. Head of bed more than 30 degrees during feedings. We will start ProStat  101 one packet per G-tube twice daily at 10:00 a.m. as well as 2:00 p.m. Flush was 30 mL of water before and after feeding. The patient is not to take anything per mouth.   ACTIVITY LIMITATIONS: As tolerated.   REFERRAL: Physical therapy 2 to 7 times a week.   FOLLOW-UP APPOINTMENT: Follow-up appointment is with Dr. Larena SoxSevilla in 2 days after discharge.    CONSULTANTS:  1. Care Management.  2. Dr. Mady HaagensenMunsoor Lateef, Nephrology. 3. Dr. Julien Nordmannimothy Gollan. 4. Dr. Irineo AxonScott Stoioff.  5. Dr. Freda MunroSaadat Khan.  6. Dr. Levora DredgeGregory Schnier.  7. Dr. Belia HemanKasa  8. Raquel Rey, RN  LABORATORY, DIAGNOSTIC AND RADIOLOGICAL DATA:  Chest x-ray, portable single view on 11/21/2011 showed a tracheostomy tube noted. Left lower lobe infiltrate consistent with pneumonia was found.  CT of head without contrast 11/21/2011 revealed evolving hemorrhagic infarct on the right. Underlying mass is felt to be unlikely given the appearance of the brain CT 10/10/2011. There is  decreasing mass effect with some continued effacement of the right lateral ventricle. Slightly increased density remains in the bed of the previous hemorrhagic infarct and is felt to be sequela of previous hemorrhage rather than recurrent hemorrhage. Close clinical observation as well as follow-up is recommended. Ultrasound of the kidneys bilaterally 11/22/2011 revealed moderate hydronephrosis on the left.  Portable single view chest x-rays 11/23/2011 revealed left lower lobe pneumonia. No other significant findings.  CT scan of abdomen and pelvis without contrast 11/23/2011: Dilated extrarenal pelvis and mild-to-moderate hydronephrosis involving left kidney. Ectasia of abdominal aorta. Findings which may represent infiltrate within the left lung base as well as findings consistent with small effusion. Atelectasis versus infiltrate in the right lung base.  On arrival, the patient's lab data revealed glucose of 470. BUN/creatinine were 19/1.66. Sodium was 155. Estimated GFR for African American would be 52.  Liver enzymes showed elevation of AST as well as ALT at 108 as well as 169, respectively. Albumin level was 2.2.  Troponin level was elevated to 0.16.  White blood cell count was high at 23.9, hemoglobin 13.6, platelets 199.  Coagulation panel revealed pro time of 15.1, INR 1.2.  Blood cultures x2 did not show any growth.  Urine culture, indwelling catheter, showed also no growth.  Urinalysis, however, revealed is yellow hazy urine with more than 500 of glucose, negative for bilirubin or ketones. Specific gravity was 1.018, pH was 5.0. Negative for blood, protein, nitrites, or leukocyte esterase, 2 red blood cells as well as 2 white blood cells but no bacteria were seen. The patient's ABGs were done on 30% FiO2 through trach collar and showed pH of 7.55, pCO2 32, pO2 51, and saturation 90.5%.  EKG showed sinus tachycardia with left axis deviation at a rate of 121, possible inferior infarct as well as  anterior infarct, but no significant change since prior EKG.   HISTORY AND PHYSICAL: The patient is a 81 year old African American male with past medical history significant for history of stroke in January 2013 with resultant left-sided weakness, presented to the hospital with complaints of dehydration as well as pneumonia and was found to be in atrial fibrillation with rapid ventricular response. Please refer to Dr. Mathews Robinsons admission note on 11/22/2011. On arrival, the patient's chest x-ray revealed a left lower lobe pneumonia. CT of the head showed hemorrhagic stroke which was felt to be less likely evolving but chronic.   HOSPITAL COURSE: The patient was admitted to the hospital. He was started on broad-spectrum antibiotic therapy and sputum cultures were obtained. Sputum cultures showed methicillin-resistant staphylococcus aureus heavy growth. Cultures were obtained on 11/22/2011. Staphylococcus aureus  was sensitive to Tetracycline as well as gentamicin, vancomycin, linezolid, rifampin, as well as trimethoprim/sulfamethoxazole, however, resistant to all other antibiotics. The patient was continued on Zyvox as well as Zosyn initially the patient had 7-day therapy of Zosyn and is being continued on Zyvox. He is to continue antibiotic for 2 more days to complete a 10-day course. His condition is stable, and the patient is continued on trach mass at 35% FiO2. His oxygenation is good at 98% at rest.  He is  afebrile. It is recommended to follow the patient's clinical condition and make decisions about rechecking his chest x-ray, if needed, especially if his oxygenation suffers. At this point, we did not feel that it was necessary to recheck his chest x-ray due to the knowledge that the patient's chest x-ray can be lagging behind his clinical improvement. It is recommended also to continue care of the patient's trach and suction him intermittently as needed.   In regards to atrial fibrillation, which was  noted in the hospital, It was felt that the patient's atrial fibrillation/rapid ventricular response was very likely related to systemic inflammatory response syndrome response reaction. The patient's atrial fibrillation converted into normal sinus rhythm as time progressed as the patient was treated with antibiotic therapy. The patient's heart rate remained stable. He is in sinus rhythm at 65 beats per minute. Initially, the patient however required amiodarone drip; later on the patient's amiodarone was changed to amiodarone per G-tube only. The patient is to continue this medication and follow up with Cardiology as an outpatient. The patient was evaluated and was followed by a cardiologist, Dr. Mariah Milling, while in the hospital.   Initially the patient had renal insufficiency, in fact, acute renal failure with creatinine of 1.66 on the day of admission 11/21/2011. The patient's kidney function worsened to 1.51 on 11/22/2011 and as high as 1.63 on 11/22/2011. However, later on it improved, and by the day of discharge the patient's creatinine normalized. On 11/27/2011, the patient's BUN and creatinine were 14 and 0.95, respectively. His sodium level was also found to be very high initially; however, the patient's sodium level normalized by the day of discharge with rehydration. It is recommended to continue the patient on diet via G-tube. It is recommended to follow the patient's BUN/creatinine levels as well as sodium levels as outpatient to make sure that he is not getting dehydrated intermittently.   The patient was noted to have mild elevation of troponin. He was followed by a cardiologist; however, the cardiologist did not feel that the patient's troponin elevation to a maximum level of 0.20 was significant. It was felt that probably it represented just renal insufficiency as well as systemic inflammatory response reaction-related tachycardia. No further interventions were recommended by Dr. Mariah Milling.    Regarding hypokalemia, the patient was noted to be hypokalemic in the hospital. Potassium was supplemented IV as well as per G-tube, and the potassium level normalized. On 11/27/2011, the patient's creatinine was 4.1.   The patient had episodes of hypoglycemia on insulin. However, that was also felt to be probably related to his acute infection. The patient was managed on sliding scale insulin, but now his Insulin N will be restarted. It is recommended to follow the patient's glucose levels periodically with sliding scale insulin, make decisions about advancement of insulin therapy if needed. No advancement was performed during his stay in the hospital time due to his hypoglycemia episode.   The patient was noted to have diarrhea which tested for C. difficile in the hospital. The patient was started on Flagyl per G-tube. He is to continue Flagyl for two weeks, which  is 14 days after last antibiotic dose.   The patient was noted to have low albumin levels, malnutrition. His prealbumin level was checked and was found to be low as well at 13. The patient's nutrition was managed by a dietitian while in the hospital who recommended current Jevity dose. It is recommended to follow the patient's albumin level as an outpatient and make decisions  about advancement of his diet, if needed.   For his chronic medical problems such as dementia, recent cerebrovascular accident, the patient is to continue his outpatient management. No changes were made in here. In regards to coronary artery disease, the patient is to continue metoprolol.   CONDITION ON DISCHARGE: The patient is being discharged in stable condition with the above-mentioned medications and follow-up. His vital signs on day of discharge: Temperature 97.8, pulse 77, respiration rate 18, blood pressure 112/64, saturation 94 to 98% on room on 35% trach aerosol mask at rest.   TIME SPENT: 40 minutes.  ____________________________ Katharina Caper,  MD rv:cbb D: 11/29/2011 11:41:24 ET T: 11/29/2011 12:34:16 ET JOB#: [id number]  cc: Katharina Caper, MD, <Dictator> Glenetta Borg, MD Briteny Fulghum MD ELECTRONICALLY SIGNED 12/11/2011 10:02

## 2015-01-12 NOTE — Consult Note (Signed)
Impression: 81yo BM w/ h/o CVA, vascular dementia, DM, Methacillin Resistant Staph aureus pneumonia and C diff colitis admitted with pneumonia.  While his immediate reason for presentation to the ER was his trach coming out, he was found to have low grade fever, leukocytosis and CXR findings for multilobar pneumonia.  He had trach cultures which have grown Pseudomonas and Staph aureus (which will presumedly be MRSA).  While both Staph and Pseudomonas can chronically colonized tracheostomies, the presence of infiltrate, fever and increased WBC rise concern for pneumonia. He is at extremely high risk for aspiration.  He receivesd nutrition via a PEG tube, but he can still aspirate oral secretions. His Pseudomonas isolate is fairly resistant.  While it could be sensitive to zosyn, there is no reliable testing available.  Would change him to ceftazidime.  The isolate is also sensitive to amikacin, but this would have potential renal side effects.  Would give him oral linezolid for the Methacillin Resistant Staph aureus. Would treat for 7 days of therapy. If he develops loose stool, would send for C. diff PCR. 7) Continue contact isolation.   Electronic Signatures: Lezlee Gills, Rosalyn GessMichael E (MD) (Signed on 22-Mar-13 14:35)  Authored   Last Updated: 22-Mar-13 14:53 by Arneisha Kincannon, Rosalyn GessMichael E (MD)

## 2015-01-12 NOTE — Discharge Summary (Signed)
PATIENT NAME:  Dylan Cohen, Dylan Cohen MR#:  [id number] DATE OF BIRTH:  1932/11/02  DATE OF ADMISSION:  12/06/2011 DATE OF DISCHARGE:  12/13/2011    PRIMARY CARE PHYSICIAN[facility]   REASON FOR ADMISSION: Dislodged tracheostomy tube.   DISCHARGE DIAGNOSES: 1. Dislodged tracheostomy tube status post replacement.  2. Multilobar pneumonia with involvement of multidrug resistant Pseudomonas and methicillin-resistant Staphylococcus aureus.   3. Systemic inflammatory response syndrome secondary to pneumonia.  4. Leukocytosis secondary to pneumonia.  5. History of coronary artery disease status post coronary artery bypass graft.  6. History of type 2 diabetes mellitus.  7. History of hemorrhagic cerebrovascular accident and resultant seizures.  8. History of seizures after stroke.  9. History of hemorrhagic cerebrovascular accident with residual aphasia status post percutaneous endoscopic gastrostomy tube insertion and chronic respiratory failure status post tracheostomy with dislodged tracheostomy tube noted during this admission status post replacement by ear, nose, throat physician.  10. History of abdominal aortic aneurysm status post repair.  11. History of paroxysmal atrial fibrillation.  12. History of vascular dementia.  13. History of methicillin-resistant Staphylococcus aureus pneumonia in the past.  14. History of Clostridium difficile colitis.   CONSULTATIONS:  1. ENT with Dr. Jenne CampusMcQueen.  2. Palliative care with Dr. Harvie JuniorPhifer.  3. Infectious disease with Dr. Leavy CellaBlocker.  4. Pulmonology, Dr. Belia HemanKasa.   DISCHARGE DISPOSITION: Skilled nursing facility/[facility].   DISCHARGE MEDICATIONS:  1. Oxygen via trach aerosol mask at 28% FiO2.  2. Ceftazidime 2 grams IV via peripheral IV q.8 hours until 12/17/2011.  3. Zyvox 600 mg via PEG tube q.12 hours until 12/17/2011. 4. Metoprolol 12.5 mg via PEG tube q.12 hours.  5. Aspirin 81 mg via PEG tube daily.  6. DuoNebs 1 dose  inhaled q.6 hours.  7. Keppra 100 mg/mL 5 mL via PEG tube b.i.d. 8. Lipitor 20 mg via PEG tube daily.  9. MiraLax 17 grams via PEG tube b.i.d.   10. Novolin N 10 units subcutaneously b.i.d.  11. Tylenol extra strength 500 mg 1 tab via PEG tube q.6 hours p.r.n.  12. Docusate 10 mg/mL oral liquid 10 mL via PEG tube b.i.d.  13. Hydrogen peroxide 3% topical solution one application topically applied around trach site every shift.   DISCHARGE ACTIVITY: Bedrest.   DISCHARGE CONDITION: Improved, stable.   DISCHARGE DIET: Jevity 1.5 kal RTH 55 mL/h via PEG tube continuously. Flush with 45 mL of water q.1 hour. Check residuals q.4 hours. Hold if greater than 250 mL of residual tube feeds for two hours then recheck and once residuals are less than 150 mL restart feeding.   DISCHARGE INSTRUCTIONS:  1. Take medications as prescribed.  2. Return to Emergency Department for recurrence of symptoms.   FOLLOW UP INSTRUCTIONS: Follow up with Dr. Larena SoxSevilla at the skilled nursing facility within one week. Patient to have his peripheral IV removed once IV antibiotic therapy has been completed.   PROCEDURES:  1. Removal of previous endotracheal tube and #6 non-cuffed, nonfenestrated Shiley was placed through the stoma back into the airway (previously dislodged trach was removed and replaced) with new trach in position. This was fastened with soft trach ties.  2. Bronchoscopy 12/08/2011 by Dr. Belia HemanKasa revealing mucoid white-yellow and thin secretions found throughout the tracheobronchial tree and BAL was performed.   LABORATORY, DIAGNOSTIC AND RADIOLOGICAL DATA:  Portable chest x-ray 12/06/2011: Volume loss and increased density within the left hemithorax. Etiology such as mucous plugging is of diagnostic consideration. Atelectasis versus  infiltrate is also a consideration.   Chest x-ray PA and lateral 12/08/2011: Moderate left pleural effusion left lower lobe and left upper lobe airspace disease which is similar in  appearance to the prior exam most concerning for multilobar pneumonia. Right lung is clear. Tracheostomy tube is in place. Heart and mediastinum stable. Osseous structures are unremarkable.   Blood cultures x2 from 12/06/2011 no growth to date.   BAL culture positive for moderate growth of pseudomonas aeruginosa which is resistant to ciprofloxacin, imipenem, levofloxacin, intermediate growth to gentamicin and sensitive to ceftazidime.   BAL culture from 12/16/2011 positive for MRSA sensitive to Zyvox.   Stool for C. difficile toxin was negative 12/12/2011.   WBC 19.1 on admission and 10.4 from 12/11/2011.   BRIEF HISTORY/HOSPITAL COURSE: Patient is a 81 year old male with past medical history of hemorrhagic cerebrovascular accident with resultant aphasia status post PEG and chronic respiratory failure status post trach with history of seizures, coronary artery disease status post coronary artery bypass graft, diabetes mellitus, Clostridium difficile colitis, MRSA pneumonia who was sent to [facility] hospital initially for dislodged tracheostomy tube. Please see dictated admission history and physical for pertinent details surrounding the onset of this hospitalization and please see below for further details.  1. Dislodged trach tube-Which has been replaced by ENT on the date of admission and secured with soft trach ties. #6 non-cuffed, nonfenestrated Shiley was placed through the stoma back into the airway which gave excellent breath sounds once it was placed and suction was then used to suction out the trach and this was noted to be clearly into the bronchial tube and trach was in good position and thereafter fastened with soft trach ties and patient did well thereafter and was receiving oxygen applied via trach collar with via Ventimask with normal oxygen sats. Skin was receiving O2 applied via trach collar with good oxygen sats thereafter. There was a concern of mucous plugging and possible pneumonia noted  on admission chest x-ray.  2. Multilobar pneumonia-With notable chest x-ray findings as above and patient had SIRS manifested by hypotension, leukocytosis, intermittent tachycardia noted at the time of admission and this was felt to be secondary to pneumonia. Blood cultures were obtained. Patient also underwent bronchoscopy with BAL and BAL cultures grew out multidrug-resistant Pseudomonas and also MRSA. Once blood cultures were obtained he was empirically started on IV antibiotics. Once BAL cultures had returned ID was consulted for optimal antibiotic therapy and Dr. Leavy Cella recommended ceftazidime for multidrug-resistant pneumonia and also Zyvox for his MRSA. Blood cultures did not reveal any growth to date. Patient has responded well to IV antibiotic therapy with resolution of his SIRS as his hypotension, tachycardia and leukocytosis have resolved. Dr. Leavy Cella recommends total of seven days of treatment and patient will receive IV antibiotics via peripheral IV until 12/17/2011 (ceftazidime) and can receive Zyvox via PEG tube.  3. Systemic inflammatory response syndrome-As above due to pneumonia, resolved with antibiotics.  4. Leukocytosis-Due to pneumonia, now resolved. Patient to continue antibiotics.  5. Diarrhea-Felt to be antibiotic side effect. This has improved. Stool for C. difficile toxin was negative. 6. History of coronary artery disease status post coronary artery bypass graft-Patient to continue aspirin and his metoprolol dose was reduced given low blood pressure noted at the time of admission.  7. Type 2 diabetes mellitus-Patient to continue Novolin N as before. Sugars have been well controlled during this hospitalization.  8. History of hemorrhagic cerebrovascular accident and seizures-Patient maintained on Keppra for history of seizures. His aspirin  should be used cautiously given history of hemorrhagic cerebrovascular accident. His trach has been replaced. PEG tube seems to be functioning  well.  9. Patient was seen by palliative care prior to discharge and per discussions between family and palliative care family was interested in maintaining the patient FULL CODE.  10. Patient's overall clinical condition has improved from admission up until the day of discharge. On 12/13/2011 patient was hemodynamically stable and was felt to be stable for discharge back to skilled nursing facility with close outpatient follow up.   TIME SPENT ON DISCHARGE: Greater than 30 minutes.  ____________________________ Elon Alas, MD knl:cms D: 12/13/2011 14:58:04 ET T: 12/13/2011 15:47:21 ET  JOB#: [id number] cc: [facility] Glenetta Borg, Charles Jonetta Osgood Lizabeth Leyden MD ELECTRONICALLY SIGNED 12/29/2011 22:01

## 2015-01-12 NOTE — Consult Note (Signed)
PATIENT NAME:  Dylan GroutBOLDEN, Timmie E MR#:  [id number] DATE OF BIRTH:  [birth year]  DATE OF CONSULTATION:  12/06/2011  ER REFERRING PHYSICIAN:  Bayard Malesandolph Brown, MD  CONSULTING PHYSICIAN:  Davina Pokehapman T. Yona Stansbury, MD  REASON FOR CONSULTATION: Dislodged tracheostomy tube.   HISTORY OF PRESENT ILLNESS: The patient is a 81 year old gentleman who had a severe stroke in January of this year, was [facility] where he underwent a tracheostomy tube and a PEG.  He subsequently was readmitted here in March and was discharged on March 11th with MRSA pneumonia, atrial fibrillation. [facility] was [facility] where his trach tube became dislodged, and he was brought to the Emergency Room. He has had no airway compromise since returning to the Emergency Room. He has been saturating nearly 100% on trach collar via a 4-0 endotracheal tube through his stoma. The ED physician was unable to get the trach tube back in. He had a #6 noncuffed, nonfenestrated Shiley in position.   PAST MEDICAL HISTORY: Significant for: 1. Coronary artery disease. 2. Diabetes. 3. Hypertension. 4. Prostate hypertrophy. 5. Abdominal aortic aneurysm. 6. Intermittent atrial fibrillation. 7. Dementia. 8. Tobacco abuse.   PAST SURGICAL HISTORY: He has had coronary artery bypass, abdominal aneurysm repair, now has trach and PEG tube.   MEDICATIONS: Multiple and listed in the chart.   SOCIAL HISTORY: Noncontributory as the patient essentially does not respond to commands.   FAMILY HISTORY: Unremarkable.     PHYSICAL EXAMINATION:  GENERAL: In no apparent distress, lying in the Emergency Room bed with a 4 endotracheal tube through his stoma.   HEENT: The external nose is benign. The external ears are clear. The oral cavity and oropharynx show dry mucous membranes.   PROCEDURE: With assistance of the ER nurse, the neck was gently extended. The endotracheal tube was removed. A #6 noncuffed, nonfenestrated Shiley was pushed through the  stoma back into the airway. This gave excellent breath sounds once it was placed. A suction was then used to suction out the trach. Clearly it was into the bronchial tubes. With the trach in position and fastened with the soft trach ties, he was returned back to the Emergency Room where he will be transferred back [facility]. ____________________________ Davina Pokehapman T. Pink Maye, MD ctm:cbb D: 12/06/2011 07:51:51 ET T: 12/06/2011 10:32:00 ET JOB#: [id number]  cc: Davina Pokehapman T. Maeola Mchaney, MD, <Dictator> Davina PokeHAPMAN T Leeloo Silverthorne MD ELECTRONICALLY SIGNED 12/28/2011 7:21

## 2015-01-12 NOTE — Consult Note (Signed)
PATIENT NAME:  Dylan Cohen, Dylan Cohen MR#:  [id number] DATE OF BIRTH:  [birth year]  DATE OF CONSULTATION:  12/10/2011  REFERRING PHYSICIAN:  Enid Baas, MD CONSULTING PHYSICIAN:  Rosalyn Gess. Garett Tetzloff, MD  REASON FOR CONSULTATION: Pneumonia.   HISTORY OF PRESENT ILLNESS: The patient is a 81 year old black man with a past history significant for diabetes, stroke, vascular dementia, and recent MRSA pneumonia who was admitted on 12/06/2011 after his tracheostomy tube dislodged. The patient was admitted to the hospital because in addition to problems with replacing the tracheostomy tube he also had leukocytosis, some fever, and x-ray findings consistent with pneumonia. The patient is unable to provide any history and the history is obtained exclusively from the chart. He presented from a nursing home and was recently hospitalized earlier this month with MRSA pneumonia. At that time, he also had C. difficile colitis. He is currently on Zosyn and vancomycin. Sputum cultures have grown Pseudomonas and staph aureus. His white count, which was 19.1 on admission, has come down to 11.6 today.   ALLERGIES: No known drug allergies.   PAST MEDICAL HISTORY:  1. Hemorrhagic stroke with severe residual weakness status post tracheostomy and PEG tube placement.  2. Diabetes.  3. Hypertension.  4. Recent Clostridium difficile colitis.  5. Vascular dementia.  6. Atrial fibrillation.  7. Abdominal aortic aneurysm repair.  8. Benign prostatic hypertrophy.  9. Coronary artery disease status post coronary artery bypass graft.  10. Recent methicillin-resistant Staphylococcus aureus pneumonia.  SOCIAL HISTORY: The patient lives in a skilled nursing facility. He is a prior smoker. No further information was available.   FAMILY HISTORY: Positive for hypertension, diabetes and stroke.  REVIEW OF SYSTEMS: Unable to obtain from the patient.   PHYSICAL EXAMINATION:   VITAL SIGNS: T-max 100.3, T-current 97.7, pulse 106,  blood pressure 154/77, 95% on aerosolized trach mask.   GENERAL: A 81 year old black man in no acute distress but appearing chronically ill.   HEENT: Normocephalic, atraumatic. Pupils were difficult to assess due to patient compliance. Unable to assess extraocular motion. Sclerae and conjunctivae appeared to be without emboli or petechiae. Oropharynx unable to assess due to patient compliance.   NECK: Midline trachea. No lymphadenopathy. No thyromegaly. A tracheostomy tube was in place.   LUNGS: Clear to auscultation bilaterally with good air movement. No focal consolidation.   HEART: Regular rate and rhythm without murmur, rub, or gallop.   ABDOMEN: Soft. No apparent tenderness. No hepatosplenomegaly. No hernia is noted. PEG tube is in place.   EXTREMITIES: No evidence for tenosynovitis.   SKIN: No rashes. No stigmata of endocarditis, specifically no Janeway lesions or Osler nodes.   NEUROLOGIC: The patient was unresponsive. He would not follow commands. He did not respond to verbal stimuli. All lower extremities were with contractures. He had stiffness of the upper extremities as well.   PSYCHIATRIC: Unable to assess.   LABS/STUDIES: BUN 13, creatinine 0.69, bicarbonate 31, anion gap of 6. LFTs from admission had an AST of 43, ALT 40, alkaline phosphatase 110, and total bilirubin 0.8. White count 11.6, hemoglobin 10.4, platelet count 190, and ANC 9.6. On admission his white count was 19.1 and has steadily been coming down.   Blood cultures from admission show no growth.   A bronchial wash culture is growing pseudomonas that is sensitive to amikacin and ceftazidime, intermediate to gentamicin, and resistant to imipenem, levofloxacin and ciprofloxacin, as well as Staphylococcus aureus, the sensitivities of which are pending.   A chest x-ray from admission shows volume loss  and increased density within the left hemithorax.   A repeat chest x-ray from 12/08/2011 showed left lower lobe and  left upper lobe airspace disease that was not significantly changed since concerning for multilobar pneumonia.   IMPRESSION: A 81 year old black man with a past history significant for stroke, vascular dementia, diabetes, MRSA pneumonia, and Clostridium difficile colitis who was admitted with pneumonia.   RECOMMENDATIONS:  1. While his immediate reason for presentation was his trach coming out, he was found to have a low-grade fever, leukocytosis, and chest x-ray findings for multilobar pneumonia. He had trach cultures which have grown Pseudomonas and staph aureus (which presumably will be MRSA).  While both Staphylococcus  and Pseudomonas can chronically colonize tracheostomies, the presence of infiltrate, fever, and increased white count raise the concern for pneumonia.  2. He is at high risk for aspiration. He received nutrition via a PEG tube, but he can still aspirate oral secretions.  3. His pseudomonas isolate is fairly resistant. While it could be sensitive to Zosyn, there is no reliable testing available. We will change him to Ceftazidime. The isolate is also sensitive to amikacin, but this would have potential renal side effects.  4. We will give him oral linezolid for the MRSA.  5. Would treat for seven days of therapy.  6. If he develops loose stool would send for C. difficile PCR.  7. I would continue contact isolation.         This is a moderately complex infectious disease case. Thank you very much for involving me in Mr. Weyman CroonBolden's care. ____________________________ Rosalyn GessMichael E. Tajae Maiolo, MD meb:slb D: 12/10/2011 14:53:13 ET T: 12/10/2011 15:02:59 ET JOB#: [id number]  cc: Rosalyn GessMichael E. Bern Fare, MD, <Dictator> Eulene Pekar E Joyel Chenette MD ELECTRONICALLY SIGNED 12/15/2011 9:15

## 2015-01-12 NOTE — Discharge Summary (Signed)
PATIENT NAME:  Dylan Cohen, Dylan Cohen MR#:  [id number] DATE OF BIRTH:  04-10-1933  DATE OF ADMISSION:  10/10/2011 DATE OF DISCHARGE:  10/11/2011  PRIMARY CARE PHYSICIAN: [facility]   FINAL DIAGNOSES:  1. Borderline troponin.  2. Transient atrial fibrillation.  3. Hypertension, accelerated.  4. Vascular dementia.  5. Thrombocytopenia.  6. Diabetes, diet controlled.  7. Tobacco abuse.  8. Fall with bleeding and left facial swelling.   MEDICATIONS ON DISCHARGE:  1. Aspirin 81 mg daily.  2. Metoprolol 50 mg twice a day.  3. Nicotine patch 14 mcg chest wall daily.  4. Lipitor 20 mg p.o. daily.  5. Risperdal 0.5 mg p.o. nightly.   DIET: Low sodium diet, 1800 ADA diet.   ACTIVITY: Activity as tolerated.   FOLLOW-UP: Follow-up in 1 to 2 weeks at the [facility] [place].   REASON FOR ADMISSION: The patient was admitted 10/10/2011 and discharged 10/11/2011. He came in with being found in a ditch by a family member.   HISTORY OF PRESENT ILLNESS: This is a 81 year old man with heart disease, hypertension, diabetes, benign prostatic hypertrophy, aneurysm repair, and dementia who was unable to provide history. It is normal for him to wander off during the day and he was found at a distance from his house in a ditch with left facial abrasion and upper lip swelling. He was found to have an elevated troponin. He was admitted to the medical floor and was initially put on aspirin, beta-blocker, statin, and heparin drip. Serial cardiac enzymes were ordered.   LABORATORY AND RADIOLOGICAL DATA DURING THE START OF THE CHART: CT scan of the head showed chronic and involutional changes without evidence of focal or acute abnormalities. Left wrist no fracture. Chest x-ray COPD. Urinalysis 2+ blood. Troponin 0.08. White blood cell count 7.2, hemoglobin and hematocrit 15.6 and 46.9, platelet count 120. Ethanol level less than 3. Glucose 155, BUN 22, creatinine 0.99, sodium 145, potassium 3.6, chloride 104, CO2 28, calcium 9.4.  Liver function tests normal. EKG showed atrial fibrillation, RSR prime, left anterior fascicular block. Next two troponins 0.10 and 0.08.   The patient was seen in consultation by Dr. Jens Somrenshaw of Cardiology who just recommended medical management. He is not a candidate for anticoagulation secondary to falls and dementia. Since the patient was not having any chest pain, the borderline troponin was treated medically with aspirin and beta-blocker. Dr. Elenore RotaJuengel also saw the patient secondary to upper lip injury and he evaluated and saw nothing that would require a suture.   HOSPITAL COURSE PER PROBLEM LIST:  1. For the borderline troponin, treated medically with aspirin and metoprolol.  2. For the transient atrial fibrillation, converted to normal sinus rhythm. Continue metoprolol to keep in sinus. Aspirin only for anticoagulation secondary to dementia and falls.  3. Accelerated hypertension. Blood pressure very variable during the hospital course. He was agitated at times also. Last blood pressure upon discharge was 153/84.  4. Vascular dementia. This is progressive. The daughter is interested in placement but the wife is not ready for this yet. Hopefully in the future they will consider this. The patient will be discharged home with family.  5. Thrombocytopenia, most likely chronic with history of EtOH in the past.  6. Diabetes, diet controlled.  7. Tobacco abuse. Smoking cessation counseling done, not sure if he understood. Nicotine patch applied.  8. With his fall with lip bleeding and facial swelling, this should resolve over time.  TIME SPENT ON DISCHARGE: Greater than 40 minutes.  ____________________________ Herschell Dimes. Renae Gloss, MD rjw:drc D: 10/11/2011 10:32:13 ET T: 10/12/2011 11:26:09 ET JOB#: [id number] cc: Herschell Dimes. Renae Gloss, MD, <Dictator>, [facility] Salley Scarlet MD ELECTRONICALLY SIGNED 10/14/2011 15:56
